# Patient Record
Sex: Female | Born: 1970 | Race: Black or African American | Hispanic: No | Marital: Married | Smoking: Never smoker
Health system: Southern US, Community
[De-identification: ages and names within clinical notes are randomized; demographics above are authoritative.]

## PROBLEM LIST (undated history)

## (undated) DIAGNOSIS — N390 Urinary tract infection, site not specified: Secondary | ICD-10-CM

## (undated) DIAGNOSIS — T7840XA Allergy, unspecified, initial encounter: Secondary | ICD-10-CM

## (undated) DIAGNOSIS — E119 Type 2 diabetes mellitus without complications: Secondary | ICD-10-CM

## (undated) DIAGNOSIS — E785 Hyperlipidemia, unspecified: Secondary | ICD-10-CM

## (undated) DIAGNOSIS — R7303 Prediabetes: Secondary | ICD-10-CM

## (undated) DIAGNOSIS — K219 Gastro-esophageal reflux disease without esophagitis: Secondary | ICD-10-CM

## (undated) HISTORY — DX: Allergy, unspecified, initial encounter: T78.40XA

## (undated) HISTORY — PX: TUBAL LIGATION: SHX77

## (undated) HISTORY — DX: Prediabetes: R73.03

## (undated) HISTORY — DX: Type 2 diabetes mellitus without complications: E11.9

## (undated) HISTORY — DX: Gastro-esophageal reflux disease without esophagitis: K21.9

## (undated) HISTORY — DX: Hyperlipidemia, unspecified: E78.5

## (undated) HISTORY — DX: Urinary tract infection, site not specified: N39.0

---

## 1999-07-07 ENCOUNTER — Other Ambulatory Visit: Admission: RE | Admit: 1999-07-07 | Discharge: 1999-07-07 | Payer: Self-pay | Admitting: Family Medicine

## 1999-12-04 ENCOUNTER — Encounter: Payer: Self-pay | Admitting: Family Medicine

## 1999-12-04 ENCOUNTER — Encounter: Admission: RE | Admit: 1999-12-04 | Discharge: 1999-12-04 | Payer: Self-pay | Admitting: Family Medicine

## 2000-06-02 ENCOUNTER — Other Ambulatory Visit: Admission: RE | Admit: 2000-06-02 | Discharge: 2000-06-02 | Payer: Self-pay | Admitting: Family Medicine

## 2007-05-10 ENCOUNTER — Ambulatory Visit (HOSPITAL_COMMUNITY): Admission: RE | Admit: 2007-05-10 | Discharge: 2007-05-10 | Payer: Self-pay | Admitting: Obstetrics and Gynecology

## 2013-04-15 ENCOUNTER — Encounter (HOSPITAL_COMMUNITY): Payer: Self-pay | Admitting: Emergency Medicine

## 2013-04-15 ENCOUNTER — Emergency Department (HOSPITAL_COMMUNITY)
Admission: EM | Admit: 2013-04-15 | Discharge: 2013-04-15 | Disposition: A | Discharge status: Home / Self Care | Payer: BC Managed Care – PPO | Source: Home / Self Care | Attending: Family Medicine | Admitting: Family Medicine

## 2013-04-15 ENCOUNTER — Other Ambulatory Visit (HOSPITAL_COMMUNITY)
Admission: RE | Admit: 2013-04-15 | Discharge: 2013-04-15 | Disposition: A | Discharge status: Home / Self Care | Payer: BC Managed Care – PPO | Source: Ambulatory Visit | Attending: Family Medicine | Admitting: Family Medicine

## 2013-04-15 DIAGNOSIS — Z113 Encounter for screening for infections with a predominantly sexual mode of transmission: Secondary | ICD-10-CM | POA: Insufficient documentation

## 2013-04-15 DIAGNOSIS — N760 Acute vaginitis: Secondary | ICD-10-CM

## 2013-04-15 DIAGNOSIS — N949 Unspecified condition associated with female genital organs and menstrual cycle: Secondary | ICD-10-CM

## 2013-04-15 DIAGNOSIS — R102 Pelvic and perineal pain: Secondary | ICD-10-CM

## 2013-04-15 LAB — CBC
HCT: 37.3 % (ref 36.0–46.0)
MCH: 26.2 pg (ref 26.0–34.0)
MCV: 78 fL (ref 78.0–100.0)
Platelets: 356 10*3/uL (ref 150–400)
RBC: 4.78 MIL/uL (ref 3.87–5.11)
WBC: 10.6 10*3/uL — ABNORMAL HIGH (ref 4.0–10.5)

## 2013-04-15 LAB — POCT URINALYSIS DIP (DEVICE)
Bilirubin Urine: NEGATIVE
Glucose, UA: NEGATIVE mg/dL
Ketones, ur: 15 mg/dL — AB
Leukocytes, UA: NEGATIVE
Nitrite: NEGATIVE
Specific Gravity, Urine: >=1.03 (ref 1.005–1.030)
Urobilinogen, UA: 0.2 mg/dL (ref 0.0–1.0)
pH: 5.5 (ref 5.0–8.0)

## 2013-04-15 LAB — POCT PREGNANCY, URINE: Preg Test, Ur: NEGATIVE

## 2013-04-15 MED ORDER — FLUCONAZOLE 150 MG PO TABS: 150.0000 mg | ORAL_TABLET | Freq: Once | ORAL | Status: DC

## 2013-04-15 MED ORDER — METRONIDAZOLE 500 MG PO TABS: 500.0000 mg | ORAL_TABLET | Freq: Two times a day (BID) | ORAL | Status: DC

## 2013-04-15 NOTE — ED Notes (Signed)
Pt c/o pelvic pain that radiates to her lower back onset today. Pt denies problems with urinating or bowel movements. Pt is alert and oriented.

## 2013-04-15 NOTE — ED Provider Notes (Signed)
Melinda Prince is a 42 y.o. female who presents to Urgent Care today for right lower corner and abdominal pain starting today. Patient has pain radiating for right groin to her right back. She denies any nausea vomiting or diarrhea. She denies any significant urinary frequency urgency or dysuria. She denies any blood in her stool. She has no history of abdominal surgery. She feels well otherwise.    History reviewed. No pertinent past medical history. History  Substance Use Topics   Smoking status: Never Smoker    Smokeless tobacco: Not on file   Alcohol Use: No   ROS as above Medications reviewed. No current facility-administered medications for this encounter.   Current Outpatient Prescriptions  Medication Sig Dispense Refill   fluconazole (DIFLUCAN) 150 MG tablet Take 1 tablet (150 mg total) by mouth once.  1 tablet  1   metroNIDAZOLE (FLAGYL) 500 MG tablet Take 1 tablet (500 mg total) by mouth 2 (two) times daily.  14 tablet  0    Exam:  BP 143/73   Pulse 81   Temp(Src) 98.3 F (36.8 C) (Oral)   Resp 16   SpO2 100%   LMP 04/07/2013 Gen: Well NAD, morbidly obese HEENT: EOMI,  MMM Lungs: CTABL Nl WOB Heart: RRR no MRG Abd: NABS, nondistended. Mildly tender right lower quadrant area no rebound or guarding Exts: Non edematous BL  LE, warm and well perfused.  GYN: Normal external genitalia. Vaginal canal with thin white discharge. Normal-appearing cervix. No masses palpated in the adnexa. Nontender cervix.  Results for orders placed during the hospital encounter of 04/15/13 (from the past 24 hour(s))  POCT URINALYSIS DIP (DEVICE)     Status: Abnormal   Collection Time    04/15/13  6:48 PM      Result Value Range   Glucose, UA NEGATIVE  NEGATIVE mg/dL   Bilirubin Urine NEGATIVE  NEGATIVE   Ketones, ur 15 (*) NEGATIVE mg/dL   Specific Gravity, Urine >=1.030  1.005 - 1.030   Hgb urine dipstick TRACE (*) NEGATIVE   pH 5.5  5.0 - 8.0   Protein, ur NEGATIVE  NEGATIVE  mg/dL   Urobilinogen, UA 0.2  0.0 - 1.0 mg/dL   Nitrite NEGATIVE  NEGATIVE   Leukocytes, UA NEGATIVE  NEGATIVE  POCT PREGNANCY, URINE     Status: None   Collection Time    04/15/13  6:54 PM      Result Value Range   Preg Test, Ur NEGATIVE  NEGATIVE  CBC     Status: Abnormal   Collection Time    04/15/13  7:18 PM      Result Value Range   WBC 10.6 (*) 4.0 - 10.5 K/uL   RBC 4.78  3.87 - 5.11 MIL/uL   Hemoglobin 12.5  12.0 - 15.0 g/dL   HCT 19.1  47.8 - 29.5 %   MCV 78.0  78.0 - 100.0 fL   MCH 26.2  26.0 - 34.0 pg   MCHC 33.5  30.0 - 36.0 g/dL   RDW 62.1  30.8 - 65.7 %   Platelets 356  150 - 400 K/uL   No results found.  Assessment and Plan: 42 y.o. female with right lower quadrant abdominal pain without clear etiology. Doubtful for appendicitis given relatively normal white blood cell count and non-peritoneal abdominal exam.  No palpable ovarian cyst is noted however my exam is limited by the patient's body habitus.  She does have a vaginal discharge which may be contributing to her pain.  Gonorrhea Chlamydia Trichomonas Gardnerella and yeast tests are pending. Plan to treat empirically with fluconazole and metronidazole.  Will followup if not improving Discussed warning signs or symptoms. Please see discharge instructions. Patient expresses understanding.      Rodolph Bong, MD 04/15/13 2008

## 2013-04-17 NOTE — ED Notes (Signed)
GC/Chlamydia neg., Affirm: Candida and Trich neg., Gardnerella pos.  Pt. adequately treated with Flagyl. Vassie Moselle 04/17/2013

## 2013-05-03 ENCOUNTER — Other Ambulatory Visit: Payer: Self-pay | Admitting: Obstetrics and Gynecology

## 2013-05-03 DIAGNOSIS — R109 Unspecified abdominal pain: Secondary | ICD-10-CM

## 2013-05-04 NOTE — Procedures (Signed)
°

## 2013-05-05 NOTE — Procedures (Signed)
°

## 2013-05-08 ENCOUNTER — Ambulatory Visit
Admission: RE | Admit: 2013-05-08 | Discharge: 2013-05-08 | Disposition: A | Discharge status: Home / Self Care | Payer: No Typology Code available for payment source | Source: Ambulatory Visit | Attending: Obstetrics and Gynecology | Admitting: Obstetrics and Gynecology

## 2013-05-08 DIAGNOSIS — R109 Unspecified abdominal pain: Secondary | ICD-10-CM

## 2013-05-08 MED ORDER — IOHEXOL 300 MG/ML  SOLN
125.0000 mL | Freq: Once | INTRAMUSCULAR | Status: AC | PRN
  Administered 2013-05-08: 125 mL via INTRAVENOUS

## 2013-05-08 NOTE — Procedures (Signed)
°

## 2013-10-27 ENCOUNTER — Other Ambulatory Visit (HOSPITAL_COMMUNITY): Payer: Self-pay | Admitting: Obstetrics and Gynecology

## 2013-10-27 DIAGNOSIS — Z1231 Encounter for screening mammogram for malignant neoplasm of breast: Secondary | ICD-10-CM

## 2013-10-31 ENCOUNTER — Ambulatory Visit (HOSPITAL_COMMUNITY)
Admission: RE | Admit: 2013-10-31 | Discharge: 2013-10-31 | Disposition: A | Discharge status: Home / Self Care | Payer: BC Managed Care – PPO | Source: Ambulatory Visit | Attending: Obstetrics and Gynecology | Admitting: Obstetrics and Gynecology

## 2013-10-31 DIAGNOSIS — Z1231 Encounter for screening mammogram for malignant neoplasm of breast: Secondary | ICD-10-CM | POA: Insufficient documentation

## 2014-03-19 ENCOUNTER — Ambulatory Visit (INDEPENDENT_AMBULATORY_CARE_PROVIDER_SITE_OTHER): Payer: BC Managed Care – PPO | Admitting: Family Medicine

## 2014-03-19 VITALS — BP 126/80 | HR 101 | Temp 98.2°F | Resp 20 | Ht 63.75 in | Wt 262.1 lb

## 2014-03-19 DIAGNOSIS — E559 Vitamin D deficiency, unspecified: Secondary | ICD-10-CM

## 2014-03-19 DIAGNOSIS — G4700 Insomnia, unspecified: Secondary | ICD-10-CM

## 2014-03-19 DIAGNOSIS — E282 Polycystic ovarian syndrome: Secondary | ICD-10-CM

## 2014-03-19 DIAGNOSIS — Z23 Encounter for immunization: Secondary | ICD-10-CM

## 2014-03-19 DIAGNOSIS — R7309 Other abnormal glucose: Secondary | ICD-10-CM

## 2014-03-19 DIAGNOSIS — E118 Type 2 diabetes mellitus with unspecified complications: Secondary | ICD-10-CM | POA: Insufficient documentation

## 2014-03-19 DIAGNOSIS — R7303 Prediabetes: Secondary | ICD-10-CM | POA: Insufficient documentation

## 2014-03-19 DIAGNOSIS — Z8249 Family history of ischemic heart disease and other diseases of the circulatory system: Secondary | ICD-10-CM

## 2014-03-19 MED ORDER — TRAZODONE HCL 50 MG PO TABS: 25.0000 mg | ORAL_TABLET | Freq: Every evening | ORAL | Status: DC | PRN

## 2014-03-19 NOTE — Progress Notes (Signed)
Subjective:   This chart was scribed for Delman Cheadle MD by Forrestine Him, [facility] Scribe. This patient was seen in room 11 and the patient's care was started 7:30 PM.    Patient ID: Melinda Prince, female    DOB: 22-Nov-1970, 43 y.o.   MRN: [id number]  Chief Complaint  Patient presents with   Annual Exam    Establish Care     HPI  HPI Comments: Melinda Prince is a 43 y.o. female with a PMHx of pre-diabetes who [facility] here for a complete annual physical examination and to establish care today. Previous PCP has relocated to [facility].   Pt reports some difficulty sleeping since the death of her Mother. She admits to using OTC Benadryl (3- 25 mg tabs) with intermittent improvement. Pt states Benadryl sometimes makes her groggy in the morning but not necessarily with each dosage. She denies trying any other sleep aids OTC. No mood symptoms such as anxiety associated.  She is followed by Dr. Johnney Ou. Last follow up she was started on Vitamin D 2000 and Metformin 500 three times daily for PCOS and pre-diabetes. At that visit, pt also had full blood work panel performed without any abnormal findings.  She reports a family history of diabetes (Mother and Father). No family or personal history of colon cancer. Mother past at the age of 73 and Father is still living.   She is UTD on mammogram follow up, pap smear follow up, and Tetanus. She has agreed to a Flu shot today. Melinda Prince was diagnosed with breast cancer and pt now has an annual mammogram as precaution. She admits to several irregular pap smears that are well controlled and managed by Dr. Garwin Prince.  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  Past Medical History  Diagnosis Date   Pre-diabetes     No Known Allergies   No current outpatient prescriptions on file prior to visit.   No current  facility-administered medications on file prior to visit.    History reviewed. No pertinent past surgical history.   Family History  Problem Relation Age of Onset   Diabetes Mother    Heart disease Mother    Hypertension Mother    Diabetes Father    Multiple sclerosis Sister     Review of Systems  Constitutional: Negative for fever and chills.  Psychiatric/Behavioral: Positive for sleep disturbance.  All other systems reviewed and are negative.   Triage Vitals: BP 126/80   Pulse 101   Temp(Src) 98.2 F (36.8 C) (Oral)   Resp 20   Ht 5' 3.75" (1.619 m)   Wt 262 lb 2 oz (118.899 kg)   BMI 45.36 kg/m2   SpO2 98%   LMP 02/01/2014   Objective:  Physical Exam  Nursing note and vitals reviewed. Constitutional: She is oriented to person, place, and time. She appears well-developed and well-nourished. No distress.  HENT:  Head: Normocephalic and atraumatic.  Right Ear: Hearing, tympanic membrane, external ear and ear canal normal.  Left Ear: Hearing, tympanic membrane, external ear and ear canal normal.  Eyes: EOM are normal.  Neck: Normal range of motion.  Cardiovascular: Normal rate, regular rhythm, S1 normal, S2 normal and normal heart sounds.   Pulmonary/Chest: Effort normal and breath sounds normal.  Abdominal: Soft. Bowel sounds are normal. She exhibits no distension. There is no tenderness. There is no rebound and no guarding.  Musculoskeletal: Normal range  of motion.  Neurological: She is alert and oriented to person, place, and time.  Skin: Skin is warm and dry.  Psychiatric: She has a normal mood and affect. Judgment normal.    Assessment & Plan:    As all routine labs were recently drawn at OB/GYN, will retrieve and coordinate with Dr. Garwin Prince regarding care. Plan for follow-up in a couple of mos in office. Elevated glucose - on metformin 500mg  tid due to pre-DM and PCOS - no trouble with compliance though diarrhea. Rec recheck a1c in 4 mos at f/u with flp and  cmp.  Severe obesity (BMI >= 40)  Polycystic ovarian syndrome  Insomnia - rec trying prn melatonin alternating with trial of prn trazodone. Ok to use benadryl alternatively but increase water intake.  Unspecified vitamin D deficiency - pt reports level increased from 23 to 26 after 4 wks of high dose rx supp.  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   metFORMIN (GLUCOPHAGE) 500 MG tablet    Sig: Take 1,500 mg by mouth daily with breakfast.   Prenatal Vit-Fe Fumarate-FA (MULTIVITAMIN-PRENATAL) 27-0.8 MG TABS tablet    Sig: Take 1 tablet by mouth daily at 12 noon.   traZODone (DESYREL) 50 MG tablet    Sig: Take 0.5-1 tablets (25-50 mg total) by mouth at bedtime as needed for sleep.    Dispense:  30 tablet    Refill:  3    I personally performed the services described in this documentation, which was scribed in my presence. The recorded information has been reviewed and considered, and addended by me as needed.  Delman Cheadle, MD MPH

## 2014-03-19 NOTE — Patient Instructions (Signed)
°Insomnia °Insomnia is frequent trouble falling and/or staying asleep. Insomnia can be a long term problem or a short term problem. Both are common. Insomnia can be a short term problem when the wakefulness is related to a certain stress or worry. Long term insomnia is often related to ongoing stress during waking hours and/or poor sleeping habits. Overtime, sleep deprivation itself can make the problem worse. Every little thing feels more severe because you are overtired and your ability to cope is decreased. °CAUSES  °· Stress, anxiety, and depression. °· Poor sleeping habits. °· Distractions such as TV in the bedroom. °· Naps close to bedtime. °· Engaging in emotionally charged conversations before bed. °· Technical reading before sleep. °· Alcohol and other sedatives. They may make the problem worse. They can hurt normal sleep patterns and normal dream activity. °· Stimulants such as caffeine for several hours prior to bedtime. °· Pain syndromes and shortness of breath can cause insomnia. °· Exercise late at night. °· Changing time zones may cause sleeping problems (jet lag). °It is sometimes helpful to have someone observe your sleeping patterns. They should look for periods of not breathing during the night (sleep apnea). They should also look to see how long those periods last. If you live alone or observers are uncertain, you can also be observed at a sleep clinic where your sleep patterns will be professionally monitored. Sleep apnea requires a checkup and treatment. Give your caregivers your medical history. Give your caregivers observations your family has made about your sleep.  °SYMPTOMS  °· Not feeling rested in the morning. °· Anxiety and restlessness at bedtime. °· Difficulty falling and staying asleep. °TREATMENT  °· Your caregiver may prescribe treatment for an underlying medical disorders. Your caregiver can give advice or help if you are using alcohol or other drugs for self-medication.  Treatment of underlying problems will usually eliminate insomnia problems. °· Medications can be prescribed for short time use. They are generally not recommended for lengthy use. °· Over-the-counter sleep medicines are not recommended for lengthy use. They can be habit forming. °· You can promote easier sleeping by making lifestyle changes such as: °¨ Using relaxation techniques that help with breathing and reduce muscle tension. °¨ Exercising earlier in the day. °¨ Changing your diet and the time of your last meal. No night time snacks. °¨ Establish a regular time to go to bed. °· Counseling can help with stressful problems and worry. °· Soothing music and white noise may be helpful if there are background noises you cannot remove. °· Stop tedious detailed work at least one hour before bedtime. °HOME CARE INSTRUCTIONS  °· Keep a diary. Inform your caregiver about your progress. This includes any medication side effects. See your caregiver regularly. Take note of: °¨ Times when you are asleep. °¨ Times when you are awake during the night. °¨ The quality of your sleep. °¨ How you feel the next day. °This information will help your caregiver care for you. °· Get out of bed if you are still awake after 15 minutes. Read or do some quiet activity. Keep the lights down. Wait until you feel sleepy and go back to bed. °· Keep regular sleeping and waking hours. Avoid naps. °· Exercise regularly. °· Avoid distractions at bedtime. Distractions include watching television or engaging in any intense or detailed activity like attempting to balance the household checkbook. °· Develop a bedtime ritual. Keep a familiar routine of bathing, brushing your teeth, climbing into bed at the same   time each night, listening to soothing music. Routines increase the success of falling to sleep faster.  Use relaxation techniques. This can be using breathing and muscle tension release routines. It can also include visualizing peaceful scenes. You can  also help control troubling or intruding thoughts by keeping your mind occupied with boring or repetitive thoughts like the old concept of counting sheep. You can make it more creative like imagining planting one beautiful flower after another in your backyard garden.  During your day, work to eliminate stress. When this is not possible use some of the previous suggestions to help reduce the anxiety that accompanies stressful situations. MAKE SURE YOU:   Understand these instructions.  Will watch your condition.  Will get help right away if you are not doing well or get worse. Document Released: 06/12/2000 Document Revised: 09/07/2011 Document Reviewed: 07/13/2007 [facility] Patient Information 2015 [place], [place]. This information is not intended to replace advice given to you by your health care provider. Make sure you discuss any questions you have with your health care provider.  Insulin Resistance Blood sugar (glucose) levels are controlled by a hormone called insulin. Insulin is made by your pancreas. When your blood glucose goes up, insulin is released into your blood. Insulin is required for your body to function normally. However, your body can become resistant to your own insulin or to insulin given to treat diabetes. In either case, insulin resistance can lead to serious problems. These problems include:  Type 2 diabetes.  Heart disease.  High blood pressure.  Stroke.  Polycystic ovary syndrome.  Fatty liver. CAUSES  Insulin resistance can develop for many different reasons. It is more likely to happen in people with these conditions or characteristics:  Obesity.  Inactivity.  Pregnancy.  High blood pressure.  Stress.  Steroid use.  Infection or severe illness.  Increased levels of cholesterol and triglycerides. SYMPTOMS  There are no symptoms. You may have symptoms related to the various complications of insulin resistance.  DIAGNOSIS  Several different  things can make your caregiver suspect you have insulin resistance. These include:  High blood glucose (hyperglycemia).  Abnormal cholesterol levels.  High uric acid levels.  Changes related to blood pressure.  Changes related to inflammation. Insulin resistance can be determined with blood tests. An elevated insulin level when you have not eaten might suggest resistance. Other more complicated tests are sometimes necessary. TREATMENT  Lifestyle changes are the most important treatment for insulin resistance.   If you are overweight and you have insulin resistance, you can improve your insulin sensitivity by losing weight.  Moderate exercise for 30-40 minutes, 4 days a week, can improve insulin sensitivity. Some medicines can also help improve your insulin sensitivity. Your caregiver can discuss these with you if they are appropriate.  HOME CARE INSTRUCTIONS   Do not smoke.  Keep your weight at a healthy level.  Get exercise.  If you have diabetes, follow your caregiver's directions.  If you have high blood pressure, follow your caregiver's directions.  Only take prescription medicines for pain, fever, or discomfort as directed by your caregiver. SEEK MEDICAL CARE IF:   You are diabetic and you are having problems keeping your blood glucose levels at target range.  You are having episodes of low blood glucose (hypoglycemia).  You feel you might be having side effects from your medicines.  You have symptoms of an illness that is not improving after 3-4 days.  You have a sore or wound that is not  healing.  You notice a change in vision or a new problem with your vision. SEEK IMMEDIATE MEDICAL CARE IF:   Your blood glucose goes below 70, especially if you have confusion, lightheadedness, or other symptoms with it.  Your blood glucose is very high (as advised by your caregiver) twice in a row.  You pass out.  You have chest pain or trouble breathing.  You have a  sudden, severe headache.  You have sudden weakness in one arm or one leg.  You have sudden difficulty speaking or swallowing.  You develop vomiting or diarrhea that is getting worse or not improving after 1 day. Document Released: 08/04/2005 Document Revised: 12/15/2011 Document Reviewed: 11/24/2012 [facility] Patient Information 2015 [place], [place]. This information is not intended to replace advice given to you by your health care provider. Make sure you discuss any questions you have with your health care provider. Diabetes Mellitus and Food It is important for you to manage your blood sugar (glucose) level. Your blood glucose level can be greatly affected by what you eat. Eating healthier foods in the appropriate amounts throughout the day at about the same time each day will help you control your blood glucose level. It can also help slow or prevent worsening of your diabetes mellitus. Healthy eating may even help you improve the level of your blood pressure and reach or maintain a healthy weight.  HOW CAN FOOD AFFECT ME? Carbohydrates Carbohydrates affect your blood glucose level more than any other type of food. Your dietitian will help you determine how many carbohydrates to eat at each meal and teach you how to count carbohydrates. Counting carbohydrates is important to keep your blood glucose at a healthy level, especially if you are using insulin or taking certain medicines for diabetes mellitus. Alcohol Alcohol can cause sudden decreases in blood glucose (hypoglycemia), especially if you use insulin or take certain medicines for diabetes mellitus. Hypoglycemia can be a life-threatening condition. Symptoms of hypoglycemia (sleepiness, dizziness, and disorientation) are similar to symptoms of having too much alcohol.  If your health care provider has given you approval to drink alcohol, do so in moderation and use the following guidelines:  Women should not have more than one drink per  day, and men should not have more than two drinks per day. One drink is equal to:  12 oz of beer.  5 oz of wine.  1 oz of hard liquor.  Do not drink on an empty stomach.  Keep yourself hydrated. Have water, diet soda, or unsweetened iced tea.  Regular soda, juice, and other mixers might contain a lot of carbohydrates and should be counted. WHAT FOODS ARE NOT RECOMMENDED? As you make food choices, it is important to remember that all foods are not the same. Some foods have fewer nutrients per serving than other foods, even though they might have the same number of calories or carbohydrates. It is difficult to get your body what it needs when you eat foods with fewer nutrients. Examples of foods that you should avoid that are high in calories and carbohydrates but low in nutrients include:  Trans fats (most processed foods list trans fats on the Nutrition Facts label).  Regular soda.  Juice.  Candy.  Sweets, such as cake, pie, doughnuts, and cookies.  Fried foods. WHAT FOODS CAN I EAT? Have nutrient-rich foods, which will nourish your body and keep you healthy. The food you should eat also will depend on several factors, including:  The calories you need.  The medicines you take.  Your weight.  Your blood glucose level.  Your blood pressure level.  Your cholesterol level. You also should eat a variety of foods, including:  Protein, such as meat, poultry, fish, tofu, nuts, and seeds (lean animal proteins are best).  Fruits.  Vegetables.  Dairy products, such as milk, cheese, and yogurt (low fat is best).  Breads, grains, pasta, cereal, rice, and beans.  Fats such as olive oil, trans fat-free margarine, canola oil, avocado, and olives. DOES EVERYONE WITH DIABETES MELLITUS HAVE THE SAME MEAL PLAN? Because every person with diabetes mellitus is different, there is not one meal plan that works for everyone. It is very important that you meet with a dietitian who will  help you create a meal plan that is just right for you. Document Released: 03/12/2005 Document Revised: 06/20/2013 Document Reviewed: 05/12/2013 [facility] Patient Information 2015 [place], [place]. This information is not intended to replace advice given to you by your health care provider. Make sure you discuss any questions you have with your health care provider.

## 2014-03-19 NOTE — Progress Notes (Deleted)
Melinda Prince is a 43 y.o. female who presents to Urgent Care today with complaints of ***  PMH reviewed.  Past Medical History  Diagnosis Date   Pre-diabetes    History reviewed. No pertinent past surgical history.  Medications reviewed. Current Outpatient Prescriptions  Medication Sig Dispense Refill   Cholecalciferol (VITAMIN D) 2000 UNITS CAPS Take 1 capsule by mouth daily.       metFORMIN (GLUCOPHAGE) 500 MG tablet Take 1,500 mg by mouth daily with breakfast.       Prenatal Vit-Fe Fumarate-FA (MULTIVITAMIN-PRENATAL) 27-0.8 MG TABS tablet Take 1 tablet by mouth daily at 12 noon.       No current facility-administered medications for this visit.    ROS as above otherwise neg.  No chest pain, palpitations, SOB, Fever, Chills, Abd pain, N/V/D.   Physical Exam:  BP 126/80   Pulse 101   Temp(Src) 98.2 F (36.8 C) (Oral)   Resp 20   Ht 5' 3.75" (1.619 m)   Wt 262 lb 2 oz (118.899 kg)   BMI 45.36 kg/m2   SpO2 98%   LMP 02/01/2014 Gen:  Alert, cooperative patient who appears stated age in no acute distress.  Vital signs reviewed. HEENT: EOMI,  MMM Pulm:  Clear to auscultation bilaterally with good air movement.  No wheezes or rales noted.   Cardiac:  Regular rate and rhythm without murmur auscultated.  Good S1/S2. Abd:  Soft/nondistended/nontender.  Good bowel sounds throughout all four quadrants.  No masses noted.  Exts: Non edematous BL  LE, warm and well perfused.   Assessment and Plan:

## 2014-04-02 NOTE — Progress Notes (Signed)
appt scheduled for follow up 06/08/14 with Dr. Brigitte Pulse.

## 2014-06-08 ENCOUNTER — Ambulatory Visit: Payer: BC Managed Care – PPO | Admitting: Family Medicine

## 2014-06-18 IMAGING — CT CT ABD-PELV W/ CM
3 of 5 series · 12 of 36 positions shown, 18 images · IV contrast (READICAT/WATER & [ID] OMNI 300)
Comparison: None.

CLINICAL DATA: Right abdominal pain for months, No previous surgery

EXAM:
CT ABDOMEN AND PELVIS WITH CONTRAST
TECHNIQUE: Multidetector CT imaging of the abdomen and pelvis was performed
using the standard protocol following bolus administration of
intravenous contrast.
CONTRAST:  125mL OMNIPAQUE IOHEXOL 300 MG/ML  SOLN

[Series 3: abd/pelvis · axial · 0.98mm/px · z∈[-333,+22]mm · 7 of 95 slices shown, 12 images]
[im 12/95  soft-tissue]
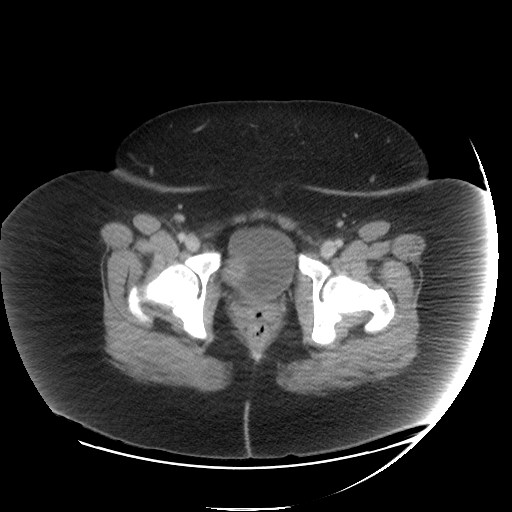
[im 12/95  bone]
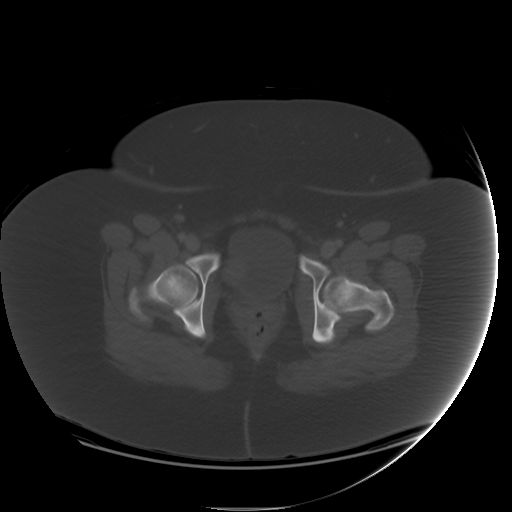
[im 24/95  soft-tissue]
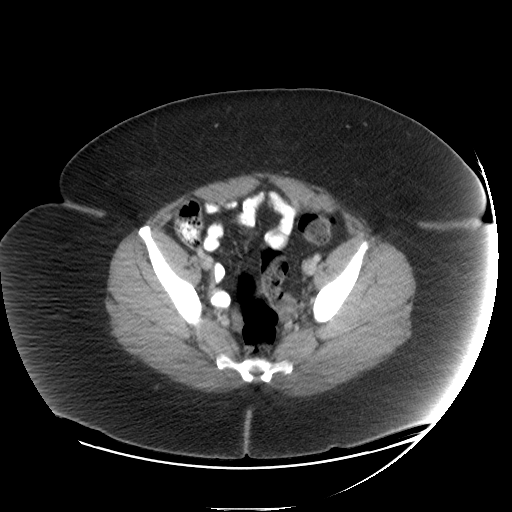
[im 36/95  soft-tissue]
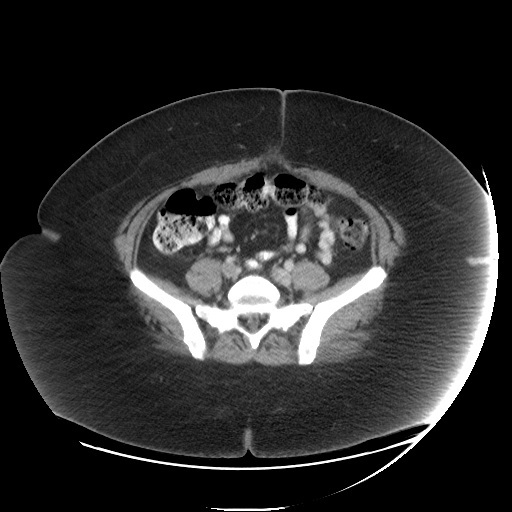
[im 48/95  soft-tissue]
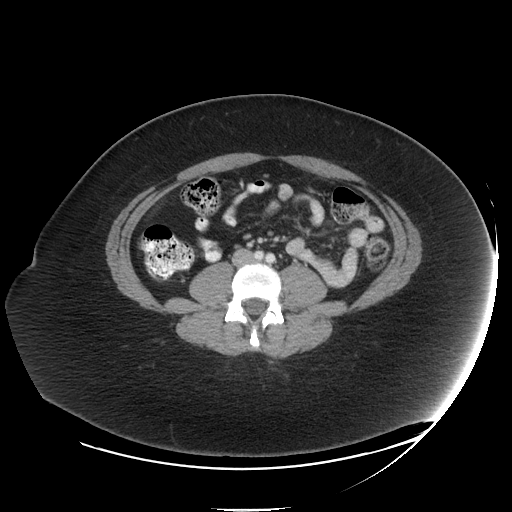
[im 48/95  lung]
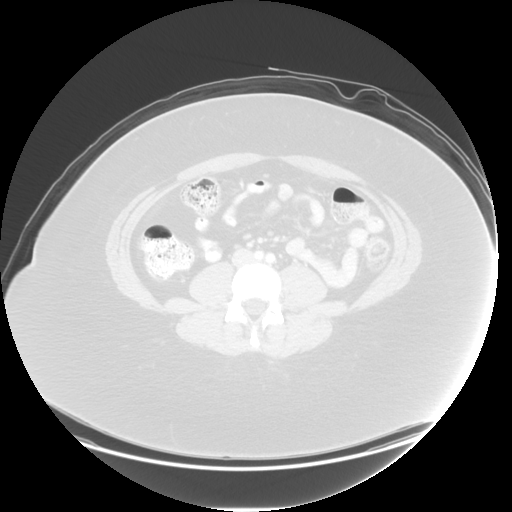
[im 59/95  soft-tissue]
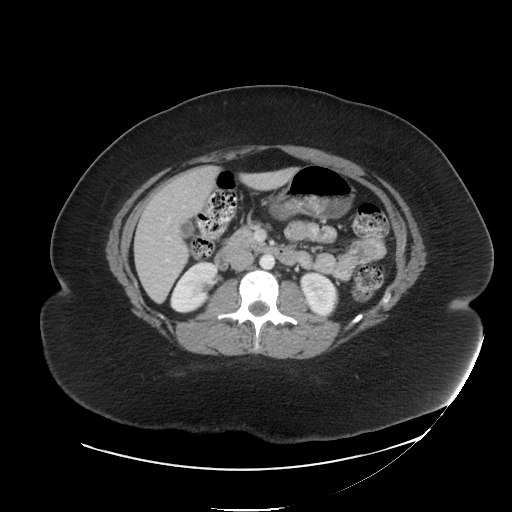
[im 59/95  lung]
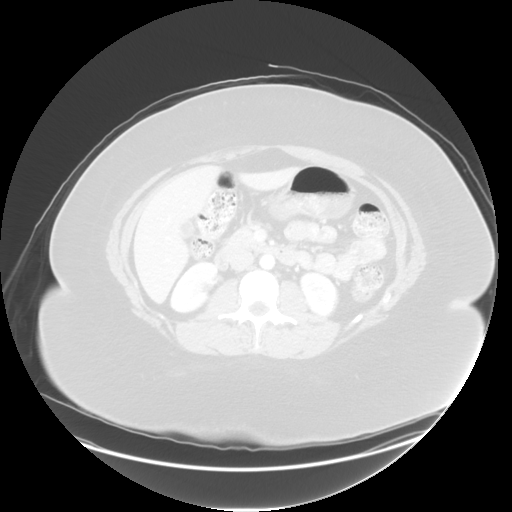
[im 71/95  soft-tissue]
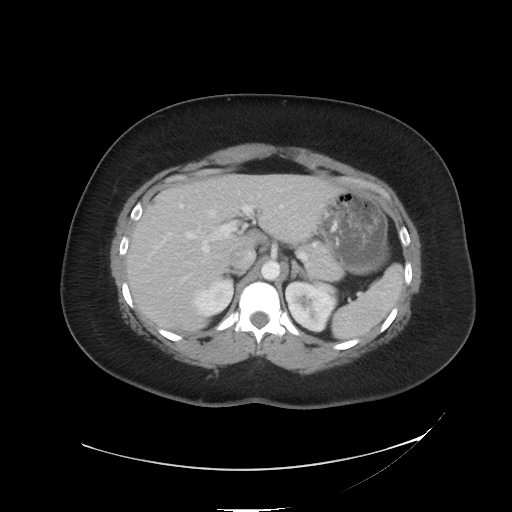
[im 71/95  lung]
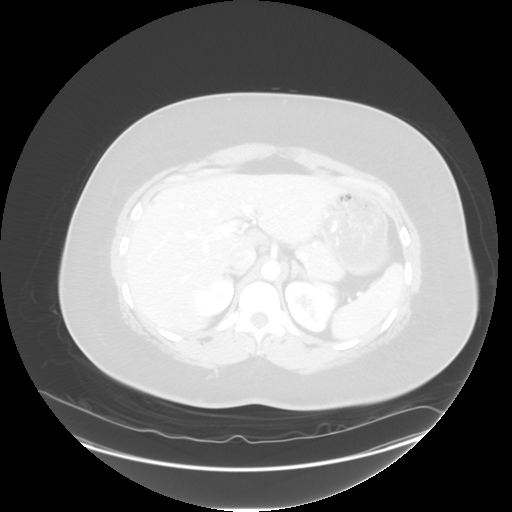
[im 83/95  soft-tissue]
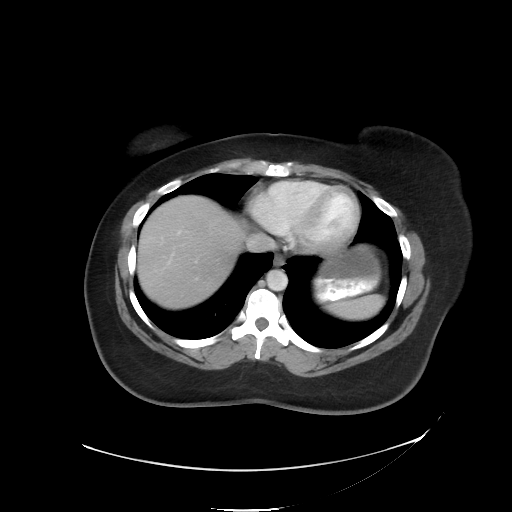
[im 83/95  lung]
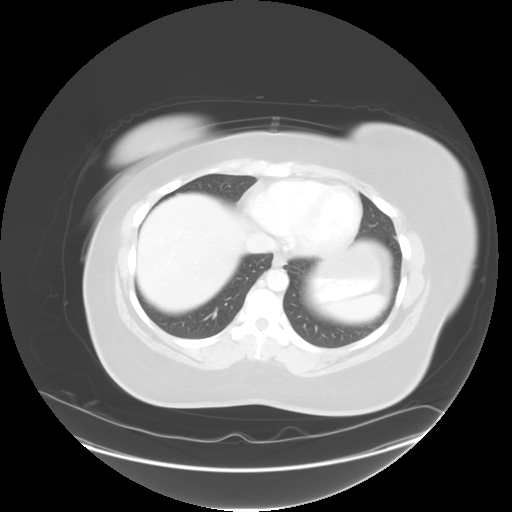

[Series 601: coronal body · coronal · 0.98mm/px · 1 of 149 slices shown, 2 images]
[im 50/149  soft-tissue]
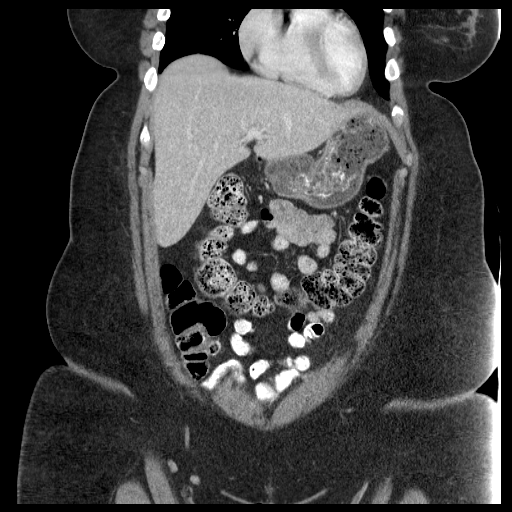
[im 50/149  bone]
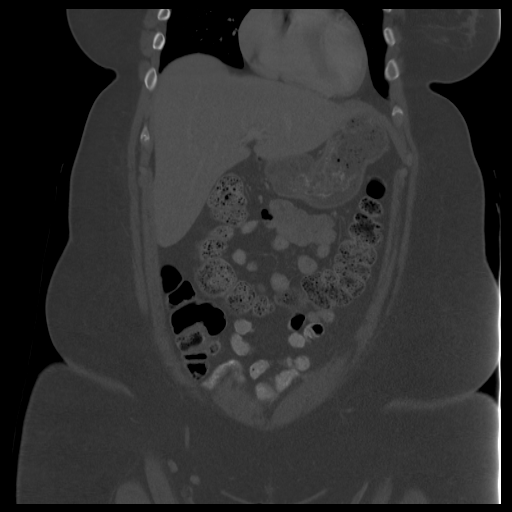

[Series 602: sagittal body · sagittal · 0.98mm/px · 4 of 196 slices shown]
[im 23/196  soft-tissue]
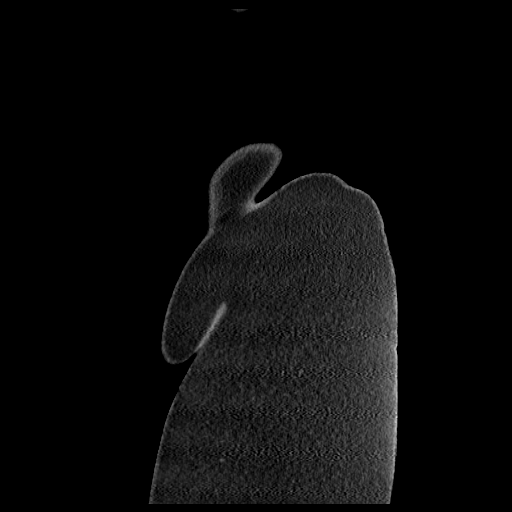
[im 46/196  soft-tissue]
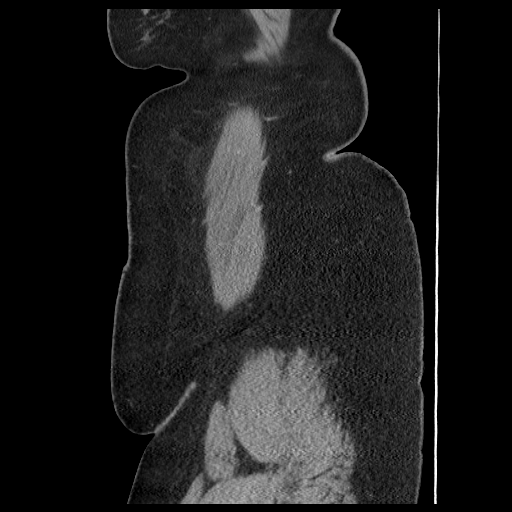
[im 69/196  soft-tissue]
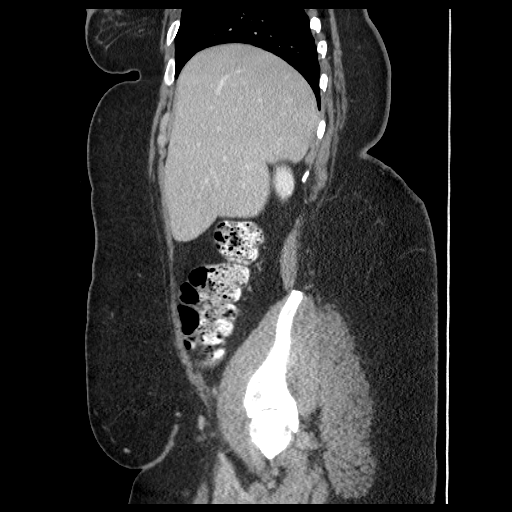
[im 92/196  soft-tissue]
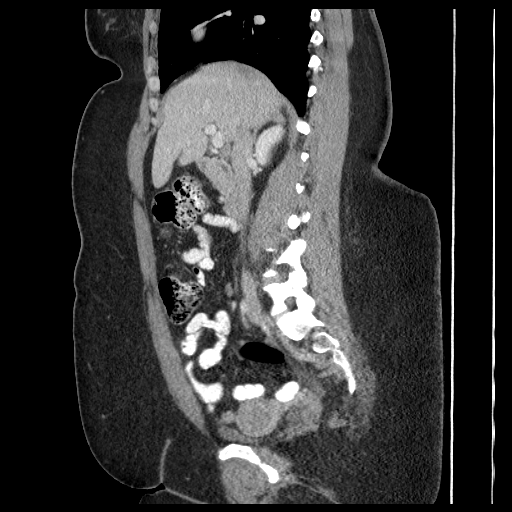

[12 of 36 positions shown; findings below may reference images not displayed]

FINDINGS: Lung bases are unremarkable. There are streaky artifacts from
patient's large body habitus. Sagittal images of the spine shows
mild degenerative changes lower thoracic spine. Abdominal aorta is
unremarkable.

Enhanced liver is unremarkable. Gallbladder is contracted without
evidence of calcified gallstones. The pancreas, spleen and adrenal
glands are unremarkable. Kidneys are symmetrical in size and
enhancement. No hydronephrosis or hydroureter. Abundant stool
throughout the colon. No small bowel obstruction. No ascites or free
air. No adenopathy.

The terminal ileum is unremarkable. Normal appendix partially
visualized in axial image 70. There is a low-density structure
within right uterine fundus measures about 2.9 cm. This is best seen
in sagittal image 93 suspicious for myometrial fibroid. A probable
hemorrhagic follicle within left ovary measures 1.6 cm. No adnexal
masses noted. The urinary bladder is unremarkable.

Kidneys are symmetrical in size and enhancement. No hydronephrosis
or hydroureter. Delayed renal images shows bilateral renal
symmetrical excretion. Bilateral visualized ureter is unremarkable.
IMPRESSION: 1. No acute inflammatory process within abdomen or pelvis.
2. Abundant stool in right colon transverse colon and left colon. No
colonic obstruction.
3. No small bowel obstruction.
4. No hydronephrosis or hydroureter.
5. Normal appendix. No pericecal inflammation.
6. There is probable myometrial fibroid in right uterine fundus
measures about 2.9 cm. Confirmation with pelvic ultrasound is
recommended. Probable hemorrhagic follicle within left ovary
measures 1.6 cm. No pelvic free fluid.

## 2014-07-02 ENCOUNTER — Ambulatory Visit (INDEPENDENT_AMBULATORY_CARE_PROVIDER_SITE_OTHER): Payer: 59

## 2014-07-02 ENCOUNTER — Ambulatory Visit (INDEPENDENT_AMBULATORY_CARE_PROVIDER_SITE_OTHER): Payer: 59 | Admitting: Physician Assistant

## 2014-07-02 VITALS — BP 140/90 | HR 75 | Temp 98.4°F | Resp 16 | Ht 65.0 in | Wt 268.0 lb

## 2014-07-02 DIAGNOSIS — R05 Cough: Secondary | ICD-10-CM

## 2014-07-02 DIAGNOSIS — R062 Wheezing: Secondary | ICD-10-CM

## 2014-07-02 DIAGNOSIS — R059 Cough, unspecified: Secondary | ICD-10-CM

## 2014-07-02 MED ORDER — ALBUTEROL SULFATE HFA 108 (90 BASE) MCG/ACT IN AERS: 2.0000 | INHALATION_SPRAY | RESPIRATORY_TRACT | Status: DC | PRN

## 2014-07-02 MED ORDER — IPRATROPIUM BROMIDE 0.02 % IN SOLN
0.5000 mg | Freq: Once | RESPIRATORY_TRACT | Status: AC
  Administered 2014-07-02: 0.5 mg via RESPIRATORY_TRACT

## 2014-07-02 MED ORDER — BECLOMETHASONE DIPROPIONATE 40 MCG/ACT IN AERS: 2.0000 | INHALATION_SPRAY | Freq: Two times a day (BID) | RESPIRATORY_TRACT | Status: DC

## 2014-07-02 MED ORDER — HYDROCOD POLST-CHLORPHEN POLST 10-8 MG/5ML PO LQCR: 5.0000 mL | Freq: Two times a day (BID) | ORAL | Status: DC | PRN

## 2014-07-02 MED ORDER — ALBUTEROL SULFATE (2.5 MG/3ML) 0.083% IN NEBU: 2.5000 mg | INHALATION_SOLUTION | Freq: Once | RESPIRATORY_TRACT | Status: DC

## 2014-07-02 MED ORDER — ALBUTEROL SULFATE (2.5 MG/3ML) 0.083% IN NEBU
2.5000 mg | INHALATION_SOLUTION | Freq: Once | RESPIRATORY_TRACT | Status: AC
  Administered 2014-07-02: 2.5 mg via RESPIRATORY_TRACT

## 2014-07-02 MED ORDER — IPRATROPIUM BROMIDE 0.02 % IN SOLN: 0.5000 mg | Freq: Once | RESPIRATORY_TRACT | Status: DC

## 2014-07-02 MED ORDER — GUAIFENESIN ER 1200 MG PO TB12: 1.0000 | ORAL_TABLET | Freq: Two times a day (BID) | ORAL | Status: DC | PRN

## 2014-07-02 NOTE — Progress Notes (Signed)
Subjective:    Patient ID: Melinda Prince, female    DOB: June 02, 1971, 44 y.o.   MRN: [id number]  HPI  This is a 44 year old female with PMH DM who is presenting with 1 week of cough. The cough is dry and staying the same since onset. Over the past few days she has developed wheezing and SOB. SOB occurs with activity only. She has never had wheezing before and does not have a history of asthma. She is also having bilateral inferior rib soreness.She is having some nasal congestion. Illness started with sore throat but this has resolved. She denies sinus pressure, otalgia,fever or chills. She has tried Copy[employer], advertising and nyquil - not helping except helping her sleep. She is not a smoker.   Review of Systems  Constitutional: Negative for fever and chills.  HENT: Positive for congestion and sore throat. Negative for ear pain and sinus pressure.   Eyes: Negative for redness.  Respiratory: Positive for cough, shortness of breath and wheezing.   Gastrointestinal: Negative for nausea, vomiting, abdominal pain and diarrhea.  Musculoskeletal: Positive for myalgias.  Skin: Negative for rash.  Psychiatric/Behavioral: Negative for sleep disturbance.    Patient Active Problem List   Diagnosis Date Noted   Family history of cardiovascular disease 03/19/2014   Unspecified vitamin D deficiency 03/19/2014   Insomnia 03/19/2014   Polycystic ovarian syndrome 03/19/2014   Severe obesity (BMI >= 40) 03/19/2014   Elevated glucose 03/19/2014   Prior to Admission medications   Medication Sig Start Date End Date Taking? Authorizing Provider  Cholecalciferol (VITAMIN D) 2000 UNITS CAPS Take 1 capsule by mouth daily.   Yes Historical Provider, MD  metFORMIN (GLUCOPHAGE) 500 MG tablet Take 1,500 mg by mouth daily with breakfast.   Yes Historical Provider, MD  Prenatal Vit-Fe Fumarate-FA (MULTIVITAMIN-PRENATAL) 27-0.8 MG TABS tablet Take 1 tablet by mouth daily at 12 noon.   Yes Historical Provider, MD   traZODone (DESYREL) 50 MG tablet Take 0.5-1 tablets (25-50 mg total) by mouth at bedtime as needed for sleep. 03/19/14  Yes Shawnee Knapp, MD   No Known Allergies  Patient's social and family history were reviewed.    Objective:   Physical Exam  Constitutional: She is oriented to person, place, and time. She appears well-developed and well-nourished. No distress.  HENT:  Head: Normocephalic and atraumatic.  Right Ear: Hearing, external ear and ear canal normal.  Left Ear: Hearing, tympanic membrane, external ear and ear canal normal.  Nose: Nose normal. Right sinus exhibits no maxillary sinus tenderness and no frontal sinus tenderness. Left sinus exhibits no maxillary sinus tenderness and no frontal sinus tenderness.  Mouth/Throat: Uvula is midline, oropharynx is clear and moist and mucous membranes are normal.  Right TM blocked by cerumen  Eyes: Conjunctivae and lids are normal. Right eye exhibits no discharge. Left eye exhibits no discharge. No scleral icterus.  Cardiovascular: Normal rate, regular rhythm, normal heart sounds, intact distal pulses and normal pulses.   No murmur heard. Pulmonary/Chest: Effort normal. No respiratory distress. She has wheezes (diffuse). She has rhonchi. She has no rales.    Bilateral inferior rib tenderness Wheezing did not improve with 1st duoneb. Wheezing somewhat improved with 2nd duoneb, but still present R>L  Musculoskeletal: Normal range of motion.  Lymphadenopathy:       Head (right side): No submental, no submandibular, no tonsillar and no occipital adenopathy present.       Head (left side): No submental, no submandibular, no tonsillar and no  occipital adenopathy present.    She has no cervical adenopathy.  Neurological: She is alert and oriented to person, place, and time.  Skin: Skin is warm, dry and intact. No lesion and no rash noted.  Psychiatric: She has a normal mood and affect. Her speech is normal and behavior is normal. Thought content  normal.   BP 140/90 mmHg   Pulse 75   Temp(Src) 98.4 F (36.9 C) (Oral)   Resp 16   Ht 5\' 5"  (1.651 m)   Wt 268 lb (121.564 kg)   BMI 44.60 kg/m2   SpO2 100%   LMP 06/25/2014  [facility] reading (PRIMARY) by  Dr. Ouida Sills: negative     Assessment & Plan:  1. Wheezing 2. Cough Pt likely has viral illness with airway reactivity. Chest xray was negative. Cough is dry. She is not having fever or chills. She will use albuterol prn wheezing and SOB. She will use qvar BID for 14 days. She will also use mucinex and tussionex for symptom control. Will return in 7-10 days if symptoms worsen or fail to improve.  - albuterol (PROVENTIL) (2.5 MG/3ML) 0.083% nebulizer solution 2.5 mg; Take 3 mLs (2.5 mg total) by nebulization once. - ipratropium (ATROVENT) nebulizer solution 0.5 mg; Take 2.5 mLs (0.5 mg total) by nebulization once. - DG Chest 2 View; Future - albuterol (PROVENTIL) (2.5 MG/3ML) 0.083% nebulizer solution 2.5 mg; Take 3 mLs (2.5 mg total) by nebulization once. - ipratropium (ATROVENT) nebulizer solution 0.5 mg; Take 2.5 mLs (0.5 mg total) by nebulization once. - albuterol (PROVENTIL HFA;VENTOLIN HFA) 108 (90 BASE) MCG/ACT inhaler; Inhale 2 puffs into the lungs every 4 (four) hours as needed for wheezing or shortness of breath (cough, shortness of breath or wheezing.).  Dispense: 1 Inhaler; Refill: 0 - beclomethasone (QVAR) 40 MCG/ACT inhaler; Inhale 2 puffs into the lungs 2 (two) times daily.  Dispense: 1 Inhaler; Refill: 0 - Guaifenesin (MUCINEX MAXIMUM STRENGTH) 1200 MG TB12; Take 1 tablet (1,200 mg total) by mouth every 12 (twelve) hours as needed.  Dispense: 14 tablet; Refill: 1 - chlorpheniramine-HYDROcodone (TUSSIONEX PENNKINETIC ER) 10-8 MG/5ML LQCR; Take 5 mLs by mouth every 12 (twelve) hours as needed for cough (cough).  Dispense: 80 mL; Refill: 0   Melinda Prince V. Drenda Freeze, MHS [facility]  07/02/2014

## 2014-07-02 NOTE — Patient Instructions (Signed)
Use steroid inhaler twice a day for 2 weeks. Use albuterol as needed for wheezing and shortness of breath. Stop taking your home cold medications. Start taking mucinex twice a day and cough syrup at night. Return in 7 days if your symptoms are not improving.

## 2014-07-19 ENCOUNTER — Telehealth: Payer: Self-pay | Admitting: *Deleted

## 2014-07-19 NOTE — Telephone Encounter (Signed)
Phoned and cancelled tomorrow's OV and patient stated she'd call back Monday to reschedule.

## 2014-07-20 ENCOUNTER — Ambulatory Visit: Payer: BC Managed Care – PPO | Admitting: Family Medicine

## 2014-08-31 ENCOUNTER — Ambulatory Visit: Payer: 59 | Admitting: Family Medicine

## 2015-01-08 ENCOUNTER — Ambulatory Visit (INDEPENDENT_AMBULATORY_CARE_PROVIDER_SITE_OTHER): Payer: 59 | Admitting: Internal Medicine

## 2015-01-08 ENCOUNTER — Encounter: Payer: Self-pay | Admitting: Internal Medicine

## 2015-01-08 ENCOUNTER — Other Ambulatory Visit (INDEPENDENT_AMBULATORY_CARE_PROVIDER_SITE_OTHER): Payer: 59

## 2015-01-08 VITALS — BP 136/86 | HR 89 | Temp 98.4°F | Resp 16 | Ht 65.5 in | Wt 275.0 lb

## 2015-01-08 DIAGNOSIS — R51 Headache: Secondary | ICD-10-CM

## 2015-01-08 DIAGNOSIS — J3089 Other allergic rhinitis: Secondary | ICD-10-CM | POA: Diagnosis not present

## 2015-01-08 DIAGNOSIS — R7309 Other abnormal glucose: Secondary | ICD-10-CM | POA: Diagnosis not present

## 2015-01-08 DIAGNOSIS — R519 Headache, unspecified: Secondary | ICD-10-CM | POA: Insufficient documentation

## 2015-01-08 DIAGNOSIS — J309 Allergic rhinitis, unspecified: Secondary | ICD-10-CM | POA: Insufficient documentation

## 2015-01-08 DIAGNOSIS — R7303 Prediabetes: Secondary | ICD-10-CM

## 2015-01-08 LAB — COMPREHENSIVE METABOLIC PANEL
ALT: 11 U/L (ref 0–35)
AST: 13 U/L (ref 0–37)
Albumin: 4 g/dL (ref 3.5–5.2)
Alkaline Phosphatase: 45 U/L (ref 39–117)
BUN: 11 mg/dL (ref 6–23)
CHLORIDE: 104 meq/L (ref 96–112)
CO2: 28 meq/L (ref 19–32)
Calcium: 9.2 mg/dL (ref 8.4–10.5)
Creatinine, Ser: 0.87 mg/dL (ref 0.40–1.20)
GFR: 90.04 mL/min (ref >60.00)
Glucose, Bld: 72 mg/dL (ref 70–99)
Potassium: 3.9 mEq/L (ref 3.5–5.1)
SODIUM: 140 meq/L (ref 135–145)
TOTAL PROTEIN: 7.7 g/dL (ref 6.0–8.3)
Total Bilirubin: 0.3 mg/dL (ref 0.2–1.2)

## 2015-01-08 LAB — LIPID PANEL
CHOLESTEROL: 170 mg/dL (ref 0–200)
HDL: 53.1 mg/dL (ref >39.00)
LDL CALC: 85 mg/dL (ref 0–99)
NonHDL: 116.9
TRIGLYCERIDES: 158 mg/dL — AB (ref 0.0–149.0)
Total CHOL/HDL Ratio: 3
VLDL: 31.6 mg/dL (ref 0.0–40.0)

## 2015-01-08 LAB — CBC
HEMATOCRIT: 39.1 % (ref 36.0–46.0)
Hemoglobin: 12.8 g/dL (ref 12.0–15.0)
MCHC: 32.6 g/dL (ref 30.0–36.0)
MCV: 79.2 fl (ref 78.0–100.0)
Platelets: 358 10*3/uL (ref 150.0–400.0)
RBC: 4.94 Mil/uL (ref 3.87–5.11)
RDW: 15.9 % — AB (ref 11.5–15.5)
WBC: 9 10*3/uL (ref 4.0–10.5)

## 2015-01-08 LAB — HEMOGLOBIN A1C: Hgb A1c MFr Bld: 6 % (ref 4.6–6.5)

## 2015-01-08 MED ORDER — OLOPATADINE HCL 0.2 % OP SOLN: 2.0000 [drp] | OPHTHALMIC | Status: DC | PRN

## 2015-01-08 MED ORDER — FLUTICASONE PROPIONATE 50 MCG/ACT NA SUSP: 2.0000 | Freq: Every day | NASAL | Status: DC

## 2015-01-08 NOTE — Assessment & Plan Note (Signed)
Checking HgA1c as it has not been checked in some time. Talked to her about the role of exercise in preventing progression to diabetes.

## 2015-01-08 NOTE — Assessment & Plan Note (Signed)
Rx for flonase and pataday eye drops for her symptoms. No indication for antibiotics at today's visit today.

## 2015-01-08 NOTE — Assessment & Plan Note (Signed)
Given her BMI will check home sleep study to rule out OSA.

## 2015-01-08 NOTE — Progress Notes (Signed)
Pre visit review using our clinic review tool, if applicable. No additional management support is needed unless otherwise documented below in the visit note.

## 2015-01-08 NOTE — Progress Notes (Signed)
° °  Subjective:    Patient ID: Melinda Prince, female    DOB: Apr 11, 1971, 44 y.o.   MRN: [id number]  HPI The patient is a new 44 YO female coming in for two reasons: seasonal allergies. She has used flonase and zyrtec and eye drops in the past. Having eye irritation and watering a lot lately. Not currently taking anything for the allergies. Some nasal congestion and drainage. No fevers or chills. No SOB.  The second problem is morning headaches. She is waking up at night time sometimes and having difficulty getting back to sleep with thoughts. Taking trazodone and that helps some. The morning headaches mostly go away as the day goes on. Never evaluated before.   Review of Systems  Constitutional: Negative for fever, activity change, appetite change, fatigue and unexpected weight change.  HENT: Positive for congestion, postnasal drip and rhinorrhea. Negative for ear discharge, ear pain, sinus pressure, sore throat and trouble swallowing.   Eyes: Positive for itching. Negative for photophobia, discharge, redness and visual disturbance.  Respiratory: Negative for cough, chest tightness, shortness of breath and wheezing.   Cardiovascular: Negative for chest pain, palpitations and leg swelling.  Gastrointestinal: Negative for nausea, abdominal pain, diarrhea, constipation and abdominal distention.  Musculoskeletal: Negative.   Skin: Negative.   Neurological: Negative.   Psychiatric/Behavioral: Negative.       Objective:   Physical Exam  Constitutional: She is oriented to person, place, and time. She appears well-developed and well-nourished.  Overweight  HENT:  Head: Normocephalic and atraumatic.  Oropharynx with redness, nasal turbinates swollen and some redness.   Eyes: EOM are normal.  Neck: Normal range of motion.  Cardiovascular: Normal rate and regular rhythm.   Pulmonary/Chest: Effort normal and breath sounds normal. No respiratory distress. She has no wheezes. She has no rales.   Abdominal: Soft. She exhibits no distension. There is no tenderness. There is no rebound.  Musculoskeletal: She exhibits no edema.  Neurological: She is alert and oriented to person, place, and time.  Skin: Skin is warm and dry.  Psychiatric: She has a normal mood and affect.   Filed Vitals:   01/08/15 1303  BP: 136/86  Pulse: 89  Temp: 98.4 F (36.9 C)  TempSrc: Oral  Resp: 16  Height: 5' 5.5" (1.664 m)  Weight: 275 lb (124.739 kg)  SpO2: 97%      Assessment & Plan:

## 2015-01-08 NOTE — Assessment & Plan Note (Signed)
Checking labs including HgA1c, CMP. Talked to her about the fact that exercise and dietary changes can sometimes prevent her pre-diabetes from becoming diabetes. She knows she needs to work on it but is not sure she is able to make changes today.

## 2015-01-08 NOTE — Patient Instructions (Signed)
We will check the blood work today and call you back even if everything is normal.   We will get the home sleep study to check for breathing problems while you sleep.   We have sent in the flonase and the eye drops for allergies that you can use.   Come back in 6-12 months for a check up or call us sooner if needed.   Diabetes and Exercise Exercising regularly is important. It is not just about losing weight. It has many health benefits, such as:  Improving your overall fitness, flexibility, and endurance.  Increasing your bone density.  Helping with weight control.  Decreasing your body fat.  Increasing your muscle strength.  Reducing stress and tension.  Improving your overall health. People with diabetes who exercise gain additional benefits because exercise:  Reduces appetite.  Improves the body's use of blood sugar (glucose).  Helps lower or control blood glucose.  Decreases blood pressure.  Helps control blood lipids (such as cholesterol and triglycerides).  Improves the body's use of the hormone insulin by:  Increasing the body's insulin sensitivity.  Reducing the body's insulin needs.  Decreases the risk for heart disease because exercising:  Lowers cholesterol and triglycerides levels.  Increases the levels of good cholesterol (such as high-density lipoproteins [HDL]) in the body.  Lowers blood glucose levels. YOUR ACTIVITY PLAN  Choose an activity that you enjoy and set realistic goals. Your health care provider or diabetes educator can help you make an activity plan that works for you. Exercise regularly as directed by your health care provider. This includes:  Performing resistance training twice a week such as push-ups, sit-ups, lifting weights, or using resistance bands.  Performing 150 minutes of cardio exercises each week such as walking, running, or playing sports.  Staying active and spending no more than 90 minutes at one time being  inactive. Even short bursts of exercise are good for you. Three 10-minute sessions spread throughout the day are just as beneficial as a single 30-minute session. Some exercise ideas include:  Taking the dog for a walk.  Taking the stairs instead of the elevator.  Dancing to your favorite song.  Doing an exercise video.  Doing your favorite exercise with a friend. RECOMMENDATIONS FOR EXERCISING WITH TYPE 1 OR TYPE 2 DIABETES   Check your blood glucose before exercising. If blood glucose levels are greater than 240 mg/dL, check for urine ketones. Do not exercise if ketones are present.  Avoid injecting insulin into areas of the body that are going to be exercised. For example, avoid injecting insulin into:  The arms when playing tennis.  The legs when jogging.  Keep a record of:  Food intake before and after you exercise.  Expected peak times of insulin action.  Blood glucose levels before and after you exercise.  The type and amount of exercise you have done.  Review your records with your health care provider. Your health care provider will help you to develop guidelines for adjusting food intake and insulin amounts before and after exercising.  If you take insulin or oral hypoglycemic agents, watch for signs and symptoms of hypoglycemia. They include:  Dizziness.  Shaking.  Sweating.  Chills.  Confusion.  Drink plenty of water while you exercise to prevent dehydration or heat stroke. Body water is lost during exercise and must be replaced.  Talk to your health care provider before starting an exercise program to make sure it is safe for you. Remember, almost any type of  activity is better than none. Document Released: 09/05/2003 Document Revised: 10/30/2013 Document Reviewed: 11/22/2012 [facility] Patient Information 2015 [place], [place]. This information is not intended to replace advice given to you by your health care provider. Make sure you discuss any questions  you have with your health care provider.

## 2015-01-09 ENCOUNTER — Telehealth: Payer: Self-pay

## 2015-01-09 NOTE — Telephone Encounter (Signed)
PA is required for Pataday eye drops. Please advise, alternative medication or proceed with PA?

## 2015-01-10 MED ORDER — AZELASTINE HCL 0.05 % OP SOLN: 1.0000 [drp] | Freq: Two times a day (BID) | OPHTHALMIC | Status: DC

## 2015-01-10 NOTE — Telephone Encounter (Signed)
Sent in alternative.

## 2015-01-16 ENCOUNTER — Encounter: Payer: Self-pay | Admitting: Internal Medicine

## 2015-01-16 ENCOUNTER — Other Ambulatory Visit: Payer: 59

## 2015-01-16 ENCOUNTER — Telehealth: Payer: Self-pay | Admitting: Internal Medicine

## 2015-01-16 ENCOUNTER — Ambulatory Visit (INDEPENDENT_AMBULATORY_CARE_PROVIDER_SITE_OTHER): Payer: 59 | Admitting: Internal Medicine

## 2015-01-16 VITALS — BP 128/82 | HR 99 | Temp 98.1°F | Resp 18 | Wt 272.0 lb

## 2015-01-16 DIAGNOSIS — R3915 Urgency of urination: Secondary | ICD-10-CM

## 2015-01-16 DIAGNOSIS — R8290 Unspecified abnormal findings in urine: Secondary | ICD-10-CM

## 2015-01-16 LAB — POCT URINALYSIS DIPSTICK
Bilirubin, UA: NEGATIVE
Glucose, UA: NEGATIVE
KETONES UA: NEGATIVE
Nitrite, UA: NEGATIVE
Urobilinogen, UA: 0.2
pH, UA: 6

## 2015-01-16 MED ORDER — NITROFURANTOIN MONOHYD MACRO 100 MG PO CAPS: 100.0000 mg | ORAL_CAPSULE | Freq: Two times a day (BID) | ORAL | Status: DC

## 2015-01-16 NOTE — Patient Instructions (Signed)
Drink as much nondairy fluids as possible. Avoid spicy foods or alcohol as  these may aggravate the bladder. Do not take decongestants. Avoid narcotics if possible.

## 2015-01-16 NOTE — Telephone Encounter (Signed)
Talked with [facility] and gave telephone order for macrobid

## 2015-01-16 NOTE — Progress Notes (Signed)
° °  Subjective:    Patient ID: Melinda Prince, female    DOB: 1971-05-01, 44 y.o.   MRN: [id number]  HPI Her symptoms began 01/14/15 as pressure with urination. She was experiencing urgency and oliguria. As of 7/19 she had dull red discoloration to her urine. She's had sweats but this is a chronic unrelated issue  Remotely she's had one urinary tract infection. She is a "prediabetic".   She has no history of genitourinary anomaly or procedures. She's not taken antibiotics recently.  Review of Systems She denies dysuria, pyuria, flank pain, vaginal discharge, fever, chills, or polyuria.    Objective:   Physical Exam  General appearance is one of good health and nourishment w/o distress.BMI: 44.56  Eyes: No conjunctival inflammation or scleral icterus is present.  Oral exam: Dental hygiene is good; lips and gums are healthy appearing.There is no oropharyngeal erythema or exudate noted.   Heart:  Normal rate and regular rhythm. S1 and S2 normal without gallop, murmur, click, rub or other extra sounds     Lungs:Chest clear to auscultation; no wheezes, rhonchi,rales ,or rubs present.No increased work of breathing.   Abdomen: bowel sounds normal, soft and non-tender without masses, organomegaly or hernias noted.  No guarding or rebound . No tenderness over the flanks to percussion  Musculoskeletal: Able to lie flat and sit up without help. Negative straight leg raising bilaterally. Gait normal  Skin:Warm & dry.  Intact without suspicious lesions or rashes ; no jaundice   Lymphatic: No lymphadenopathy is noted about the head, neck, axilla.              Assessment & Plan:  #1 urgency of urination.  #2 oliguria.  #3 abnormal urinalysis with 3+ leukocytes.  See orders and recommendations

## 2015-01-16 NOTE — Telephone Encounter (Signed)
Patient called to advise that [facility] [address] did not receive rx for macrobid. She spoke to them about 5 mins ago. Please call [facility] to follow up. Script reads that it was phoned in.

## 2015-01-16 NOTE — Progress Notes (Signed)
Pre visit review using our clinic review tool, if applicable. No additional management support is needed unless otherwise documented below in the visit note. °

## 2015-01-19 LAB — URINE CULTURE

## 2015-02-11 DIAGNOSIS — G4733 Obstructive sleep apnea (adult) (pediatric): Secondary | ICD-10-CM | POA: Diagnosis not present

## 2015-02-13 DIAGNOSIS — G4733 Obstructive sleep apnea (adult) (pediatric): Secondary | ICD-10-CM | POA: Diagnosis not present

## 2015-02-14 ENCOUNTER — Other Ambulatory Visit: Payer: Self-pay | Admitting: *Deleted

## 2015-02-14 DIAGNOSIS — R7303 Prediabetes: Secondary | ICD-10-CM

## 2015-02-18 ENCOUNTER — Telehealth: Payer: Self-pay | Admitting: Pulmonary Disease

## 2015-02-18 NOTE — Telephone Encounter (Signed)
Pt never seen in office. HST done 02/11/15. Requesting results. Please advise Dr. Elsworth Soho thanks

## 2015-02-18 NOTE — Telephone Encounter (Signed)
From [facility] Patient calling about HST Results. She has not heard anything. HST done 02/11/15

## 2015-02-20 NOTE — Telephone Encounter (Signed)
Pt informed of results. Nothing further needed.

## 2015-02-20 NOTE — Telephone Encounter (Signed)
Dr. Elsworth Soho,  [place] Clap report in Epic Please advise.

## 2015-02-20 NOTE — Telephone Encounter (Signed)
No evidence of OSA Defer to PCP

## 2015-02-20 NOTE — Telephone Encounter (Signed)
LMTC x 1

## 2015-03-25 ENCOUNTER — Encounter (HOSPITAL_COMMUNITY): Payer: Self-pay | Admitting: Emergency Medicine

## 2015-03-25 ENCOUNTER — Emergency Department (HOSPITAL_COMMUNITY)
Admission: EM | Admit: 2015-03-25 | Discharge: 2015-03-25 | Discharge status: Against Medical Advice | Payer: 59 | Attending: Emergency Medicine | Admitting: Emergency Medicine

## 2015-03-25 DIAGNOSIS — L299 Pruritus, unspecified: Secondary | ICD-10-CM | POA: Diagnosis present

## 2015-03-25 NOTE — ED Notes (Signed)
Pt. reports itching at hands and feet onset this evening unrelieved by OTC Benadryl at 10 pm .  Airway intact / no tongue swelling , respirations unlabored .

## 2015-03-25 NOTE — ED Notes (Signed)
Pt's states that she feels better after Benadryl and that she is going to go home and doesn't want to stay. Pt encouraged to stay but she said if it gets worse she will come back

## 2015-06-10 ENCOUNTER — Other Ambulatory Visit: Payer: Self-pay | Admitting: Physician Assistant

## 2015-06-10 ENCOUNTER — Telehealth: Payer: Self-pay | Admitting: Internal Medicine

## 2015-06-10 NOTE — Telephone Encounter (Signed)
Pt requesting refills for albuterol (PROVENTIL) (2.5 MG/3ML) 0.083% nebulizer solution 2.5 mg  [id number], beclomethasone (QVAR) 40 MCG/ACT inhaler [id number] and chlorpheniramine-HYDROcodone Amanda Cockayne [facility] ER) 10-8 MG/5ML Gaspar Skeeters [id number] DISCONTINUED  Pharmacy is [name] She is not feeling well and is having a hard time breathing

## 2015-06-10 NOTE — Telephone Encounter (Signed)
She may need a visit as none of these medicines are on her list. Please schedule acute for sickness.

## 2015-06-10 NOTE — Telephone Encounter (Signed)
Pt has an appt with Marya Amsler tomorrow.

## 2015-06-11 ENCOUNTER — Ambulatory Visit: Payer: 59 | Admitting: Family

## 2015-06-11 NOTE — Telephone Encounter (Signed)
Informed pt on vm °

## 2015-08-07 ENCOUNTER — Ambulatory Visit: Payer: Self-pay | Admitting: Internal Medicine

## 2015-08-16 ENCOUNTER — Encounter: Payer: Self-pay | Admitting: Family

## 2015-08-16 ENCOUNTER — Ambulatory Visit (INDEPENDENT_AMBULATORY_CARE_PROVIDER_SITE_OTHER): Payer: PRIVATE HEALTH INSURANCE | Admitting: Family

## 2015-08-16 VITALS — BP 136/84 | HR 86 | Temp 97.8°F | Resp 16 | Ht 65.05 in | Wt 257.0 lb

## 2015-08-16 DIAGNOSIS — M25561 Pain in right knee: Secondary | ICD-10-CM | POA: Diagnosis not present

## 2015-08-16 DIAGNOSIS — R059 Cough, unspecified: Secondary | ICD-10-CM | POA: Insufficient documentation

## 2015-08-16 DIAGNOSIS — R05 Cough: Secondary | ICD-10-CM

## 2015-08-16 MED ORDER — NAPROXEN-ESOMEPRAZOLE 500-20 MG PO TBEC: 1.0000 | DELAYED_RELEASE_TABLET | Freq: Two times a day (BID) | ORAL | Status: DC

## 2015-08-16 MED ORDER — DICLOFENAC SODIUM 2 % TD SOLN: 1.0000 "application " | Freq: Every day | TRANSDERMAL | Status: DC

## 2015-08-16 MED ORDER — FLUTICASONE FUROATE-VILANTEROL 100-25 MCG/INH IN AEPB: 1.0000 | INHALATION_SPRAY | Freq: Every day | RESPIRATORY_TRACT | Status: DC

## 2015-08-16 NOTE — Progress Notes (Signed)
Pre visit review using our clinic review tool, if applicable. No additional management support is needed unless otherwise documented below in the visit note.

## 2015-08-16 NOTE — Assessment & Plan Note (Signed)
Symptoms and exam with concern for reactive airway disease. Continue current dosage of albuterol. Start Breo. Follow-up in one week by email to determine effectiveness of controlling symptoms. Consider possible pulmonary function testing if symptoms are well controlled or prednisone if continues to experience worsening symptoms.

## 2015-08-16 NOTE — Progress Notes (Signed)
Subjective:    Patient ID: Melinda Prince, female    DOB: 1971/03/26, 45 y.o.   MRN: [id number]  Chief Complaint  Patient presents with   Cough    has a cough and sore throat that keep coming and going, some wheezing occurs too, she will use an inhaler and it will all go away for a bit then come back, has a issue with her right knee, has 2 places on it that bother her    HPI:  Melinda Prince is a 45 y.o. female who  has a past medical history of Pre-diabetes and UTI (lower urinary tract infection). and presents today for an acute office visit.  1.) Cough - Associated symptom of a cough has been going on for a few months. Notes she works in [employer] and the doors are open making the atmosphere cold. No fevers or shortness of breath. Does have wheezing which is modified with the albuterol which does improve the symptoms. Previously evaluated in Urgent Care where a chest x-ray was negative and she was believed to have a reactive airway concern. Course of her symptoms waxes and wanes and keeps coming back.   2.) Right knee pain - This is a new problem. Associated symptom of pain located in her anterior right knee has been going on for a couple of months. Pain is achey and described as if it is going to pop. Denies trauma. She does stand at work for long periods. Timing of symptoms is worse after work. Modifying factors include ice and elevation which has helped make her symptoms manageable.  No Known Allergies   Current Outpatient Prescriptions on File Prior to Visit  Medication Sig Dispense Refill   azelastine (OPTIVAR) 0.05 % ophthalmic solution Place 1 drop into both eyes 2 (two) times daily. 6 mL 12   Cholecalciferol (VITAMIN D) 2000 UNITS CAPS Take 1 capsule by mouth daily.     fluticasone (FLONASE) 50 MCG/ACT nasal spray Place 2 sprays into both nostrils daily. 16 g 6   nitrofurantoin, macrocrystal-monohydrate, (MACROBID) 100 MG capsule Take 1 capsule (100 mg  total) by mouth 2 (two) times daily. 14 capsule 0   Olopatadine HCl 0.2 % SOLN Apply 2 drops to eye as needed (eye itching, pain). 2.5 mL 11   Prenatal Vit-Fe Fumarate-FA (MULTIVITAMIN-PRENATAL) 27-0.8 MG TABS tablet Take 1 tablet by mouth daily at 12 noon.     QVAR 40 MCG/ACT inhaler INHALE TWO PUFFS BY MOUTH TWICE DAILY 1 Inhaler 0   traZODone (DESYREL) 50 MG tablet Take 0.5-1 tablets (25-50 mg total) by mouth at bedtime as needed for sleep. 30 tablet 3   VENTOLIN HFA 108 (90 BASE) MCG/ACT inhaler USE 2 PUFFS EVERY 4 HOURS AS NEEDED FOR WHEEZING OR SHORTNESS OF  BREATH 1 Inhaler 0   No current facility-administered medications on file prior to visit.    Past Medical History  Diagnosis Date   Pre-diabetes    UTI (lower urinary tract infection)       Review of Systems  Constitutional: Negative for fever and chills.  Respiratory: Positive for cough and wheezing. Negative for chest tightness and shortness of breath.   Musculoskeletal:       Positive for right knee pain.  Neurological: Negative for weakness and numbness.      Objective:    BP 136/84 mmHg   Pulse 86   Temp(Src) 97.8 F (36.6 C) (Oral)   Resp 16   Ht 5' 5.05" (1.652 m)   Wt 257  lb (116.574 kg)   BMI 42.72 kg/m2   SpO2 98% Nursing note and vital signs reviewed.  Physical Exam  Constitutional: She is oriented to person, place, and time. She appears well-developed and well-nourished. No distress.  Cardiovascular: Normal rate, regular rhythm, normal heart sounds and intact distal pulses.   Pulmonary/Chest: Effort normal and breath sounds normal.  Musculoskeletal:  Right knee pain - no obvious deformity, discoloration, or edema noted. Anterior knee pain around the patellar tendon and prepatellar bursa. Range of motion is within normal limits and no discomfort. Strength is 4-5+. Distal pulses and sensation are intact and appropriate.  Neurological: She is alert and oriented to person, place, and time.  Skin: Skin is  warm and dry.  Psychiatric: She has a normal mood and affect. Her behavior is normal. Judgment and thought content normal.       Assessment & Plan:   Problem List Items Addressed This Visit      Other   Right knee pain - Primary    Symptoms and exam consistent with possible bursitis. Treat conservatively with ice and compression. Start Pennsaid. Continue over the counter anti-inflammatories. Initiate home exercise therapy. Also discussed weight loss to help with symptoms.  Follow up in 3 weeks.       Relevant Medications   Diclofenac Sodium (PENNSAID) 2 % SOLN   Naproxen-Esomeprazole (VIMOVO) 500-20 MG TBEC   Cough    Symptoms and exam with concern for reactive airway disease. Continue current dosage of albuterol. Start Breo. Follow-up in one week by email to determine effectiveness of controlling symptoms. Consider possible pulmonary function testing if symptoms are well controlled or prednisone if continues to experience worsening symptoms.      Relevant Medications   fluticasone furoate-vilanterol (BREO ELLIPTA) 100-25 MCG/INH AEPB

## 2015-08-16 NOTE — Assessment & Plan Note (Signed)
Symptoms and exam consistent with possible bursitis. Treat conservatively with ice and compression. Start Pennsaid. Continue over the counter anti-inflammatories. Initiate home exercise therapy. Also discussed weight loss to help with symptoms.  Follow up in 3 weeks.

## 2015-08-16 NOTE — Patient Instructions (Signed)
Thank you for choosing [facility].  Summary/Instructions:  Please ice 2-3 times per day and after work or as needed. Over-the-counter anti-inflammatories as needed for discomfort. Recommend compression with a knee sleeve. Home exercises daily.  Start Breo daily for 1 week and please follow up via MyChart in 1 week.   Your prescription(s) have been submitted to your pharmacy or been printed and provided for you. Please take as directed and contact our office if you believe you are having problem(s) with the medication(s) or have any questions.  If your symptoms worsen or fail to improve, please contact our office for further instruction, or in case of emergency go directly to the emergency room at the closest medical facility.    Prepatellar Bursitis With Rehab  Bursitis is a condition that is characterized by inflammation of a bursa. Saunders Revel exists in many areas of the body. They are fluid-filled sacs that lie between a soft tissue (skin, tendon, or ligament) and a bone, and they reduce friction between the structures as well as the stress placed on the soft tissue. Prepatellar bursitis is inflammation of the bursa that lies between the skin and the kneecap (patella). This condition often causes pain over the patella. SYMPTOMS   Pain, tenderness, and/or inflammation over the patella.  Pain that worsens with movement of the knee joint.  Decreased range of motion for the knee joint.  A crackling sound (crepitation) when the bursa is moved or touched.  Occasionally, painless swelling of the bursa.  Fever (when infected). CAUSES  Bursitis is caused by damage to the bursa, which results in an inflammatory response. Common mechanisms of injury include:  Direct trauma to the front of the knee.  Repetitive and/or stressful use of the knee. RISK INCREASES WITH:  Activities in which kneeling and/or falling on one's knees is likely (volleyball or football).  Repetitive and  stressful training, especially if it involves running on hills.  Improper training techniques, such as a sudden increase in the intensity, frequency, or duration of training.  Failure to warm up properly before activity.  Poor technique.  Artificial turf. PREVENTION   Avoid kneeling or falling on your knees.  Warm up and stretch properly before activity.  Allow for adequate recovery between workouts.  Maintain physical fitness:  Strength, flexibility, and endurance.  Cardiovascular fitness.  Learn and use proper technique. When possible, have a coach correct improper technique.  Wear properly fitted and padded protective equipment (knee pads). PROGNOSIS  If treated properly, then the symptoms of prepatellar bursitis usually resolve within 2 weeks. RELATED COMPLICATIONS   Recurrent symptoms that result in a chronic problem.  Prolonged healing time, if improperly treated or reinjured.  Limited range of motion.  Infection of bursa.  Chronic inflammation or scarring of bursa. TREATMENT  Treatment initially involves the use of ice and medication to help reduce pain and inflammation. The use of strengthening and stretching exercises may help reduce pain with activity, especially those of the quadriceps and hamstring muscles. These exercises may be performed at home or with referral to a therapist. Your caregiver may recommend knee pads when you return to playing sports, in order to reduce the stress on the prepatellar bursa. If symptoms persist despite treatment, then your caregiver may drain fluid out with a needle (aspirate) the bursa. If symptoms persist for greater than 6 months despite nonsurgical (conservative) treatment, then surgery may be recommended to remove the bursa.  MEDICATION  If pain medication is necessary, then nonsteroidal anti-inflammatory medications, such  as aspirin and ibuprofen, or other minor pain relievers, such as acetaminophen, are often  recommended.  Do not take pain medication for 7 days before surgery.  Prescription pain relievers may be given if deemed necessary by your caregiver. Use only as directed and only as much as you need.  Corticosteroid injections may be given by your caregiver. These injections should be reserved for the most serious cases, because they may only be given a certain number of times. HEAT AND COLD  Cold treatment (icing) relieves pain and reduces inflammation. Cold treatment should be applied for 10 to 15 minutes every 2 to 3 hours for inflammation and pain and immediately after any activity that aggravates your symptoms. Use ice packs or massage the area with a piece of ice (ice massage).  Heat treatment may be used prior to performing the stretching and strengthening activities prescribed by your caregiver, physical therapist, or athletic trainer. Use a heat pack or soak the injury in warm water. SEEK MEDICAL CARE IF:  Treatment seems to offer no benefit, or the condition worsens.  Any medications produce adverse side effects. EXERCISES RANGE OF MOTION (ROM) AND STRETCHING EXERCISES - Prepatellar Bursitis These exercises may help you when beginning to rehabilitate your injury. Your symptoms may resolve with or without further involvement from your physician, physical therapist or athletic trainer. While completing these exercises, remember:   Restoring tissue flexibility helps normal motion to return to the joints. This allows healthier, less painful movement and activity.  An effective stretch should be held for at least 30 seconds.  A stretch should never be painful. You should only feel a gentle lengthening or release in the stretched tissue. STRETCH - Hamstrings, Standing  Stand or sit and extend your right / left leg, placing your foot on a chair or foot stool  Keeping a slight arch in your low back and your hips straight forward.  Lead with your chest and lean forward at the waist  until you feel a gentle stretch in the back of your right / left knee or thigh. (When done correctly, this exercise requires leaning only a small distance.)  Hold this position for __________ seconds. Repeat __________ times. Complete this stretch __________ times per day. STRETCH - Quadriceps, Prone   Lie on your stomach on a firm surface, such as a bed or padded floor.  Bend your right / left knee and grasp your ankle. If you are unable to reach, your ankle or pant leg, use a belt around your foot to lengthen your reach.  Gently pull your heel toward your buttocks. Your knee should not slide out to the side. You should feel a stretch in the front of your thigh and/or knee.  Hold this position for __________ seconds. Repeat __________ times. Complete this stretch __________ times per day.  STRETCH - Hamstrings/Adductors, V-Sit   Sit on the floor with your legs extended in a large "V," keeping your knees straight.  With your head and chest upright, bend at your waist reaching for your right foot to stretch your left adductors.  You should feel a stretch in your left inner thigh. Hold for __________ seconds.  Return to the upright position to relax your leg muscles.  Continuing to keep your chest upright, bend straight forward at your waist to stretch your hamstrings.  You should feel a stretch behind both of your thighs and/or knees. Hold for __________ seconds.  Return to the upright position to relax your leg muscles.  Repeat  steps 2 through 4. Repeat __________ times. Complete this exercise __________ times per day.  STRENGTHENING EXERCISES - Prepatellar Bursitis  These exercises may help you when beginning to rehabilitate your injury. They may resolve your symptoms with or without further involvement from your physician, physical therapist or athletic trainer. While completing these exercises, remember:  Muscles can gain both the endurance and the strength needed for everyday  activities through controlled exercises.  Complete these exercises as instructed by your physician, physical therapist or athletic trainer. Progress the resistance and repetitions only as guided. STRENGTH - Quadriceps, Isometrics  Lie on your back with your right / left leg extended and your opposite knee bent.  Gradually tense the muscles in the front of your right / left thigh. You should see either your kneecap slide up toward your hip or increased dimpling just above the knee. This motion will push the back of the knee down toward the floor/mat/bed on which you are lying.  Hold the muscle as tight as you can without increasing your pain for __________ seconds.  Relax the muscles slowly and completely in between each repetition. Repeat __________ times. Complete this exercise __________ times per day.  STRENGTH - Quadriceps, Short Arcs   Lie on your back. Place a __________ inch towel roll under your knee so that the knee slightly bends.  Raise only your lower leg by tightening the muscles in the front of your thigh. Do not allow your thigh to rise.  Hold this position for __________ seconds. Repeat __________ times. Complete this exercise __________ times per day.  OPTIONAL ANKLE WEIGHTS: Begin with ____________________, but DO NOT exceed ____________________. Increase in1 lb/0.5 kg increments.  STRENGTH - Quadriceps, Straight Leg Raises  Quality counts! Watch for signs that the quadriceps muscle is working to insure you are strengthening the correct muscles and not "cheating" by substituting with healthier muscles.  Lay on your back with your right / left leg extended and your opposite knee bent.  Tense the muscles in the front of your right / left thigh. You should see either your kneecap slide up or increased dimpling just above the knee. Your thigh may even quiver.  Tighten these muscles even more and raise your leg 4 to 6 inches off the floor. Hold for __________  seconds.  Keeping these muscles tense, lower your leg.  Relax the muscles slowly and completely in between each repetition. Repeat __________ times. Complete this exercise __________ times per day.  STRENGTH - Quadriceps, Step-Ups   Use a thick book, step or step stool that is __________ inches tall.  Holding a wall or counter for balance only, not support.  Slowly step-up with your right / left foot, keeping your knee in line with your hip and foot. Do not allow your knee to bend so far that you cannot see your toes.  Slowly unlock your knee and lower yourself to the starting position. Your muscles, not gravity, should lower you. Repeat __________ times. Complete this exercise __________ times per day.   This information is not intended to replace advice given to you by your health care provider. Make sure you discuss any questions you have with your health care provider.   Document Released: 06/15/2005 Document Revised: 03/06/2015 Document Reviewed: 09/27/2008 Elsevier Interactive Patient Education [employer].

## 2015-08-23 ENCOUNTER — Telehealth: Payer: Self-pay | Admitting: Internal Medicine

## 2015-08-23 NOTE — Telephone Encounter (Signed)
If this is regarding the medicines for the knee pain she can refill if needed and take as needed for pain.

## 2015-08-23 NOTE — Telephone Encounter (Signed)
Pt called said that both meds are working and she wants to know if she is suppose to get a refill on these and keep taking them or what she should do?

## 2015-08-26 NOTE — Telephone Encounter (Signed)
Patient called back in  TO CLARIFY   Patient was referring to fluticasone furoate-vilanterol (BREO ELLIPTA) 100-25 MCG/INH AEPB [id number] . She states that she wants to make sure that she needs to continue to take it after this dose. The pain meds are doing fine, no problems or questions.

## 2015-08-26 NOTE — Telephone Encounter (Signed)
Patient aware.

## 2015-08-26 NOTE — Telephone Encounter (Signed)
Yes, just use as needed and okay to stop once done.

## 2015-08-26 NOTE — Telephone Encounter (Signed)
Left message for patient to call me back. I need to clarify what medications she is talking about in the below message.

## 2015-08-27 ENCOUNTER — Telehealth: Payer: Self-pay | Admitting: Internal Medicine

## 2015-08-27 ENCOUNTER — Other Ambulatory Visit: Payer: Self-pay | Admitting: Geriatric Medicine

## 2015-08-27 MED ORDER — FLUTICASONE PROPIONATE 50 MCG/ACT NA SUSP: 2.0000 | Freq: Every day | NASAL | Status: DC

## 2015-08-27 NOTE — Telephone Encounter (Signed)
Pt called in and would like a refill on her fluticasone (FLONASE) 50 MCG/ACT nasal spray  [facility]

## 2015-08-27 NOTE — Telephone Encounter (Signed)
Sent to [name]. Patient aware.

## 2015-09-13 ENCOUNTER — Telehealth: Payer: Self-pay | Admitting: Internal Medicine

## 2015-09-13 MED ORDER — BENZONATATE 100 MG PO CAPS: 100.0000 mg | ORAL_CAPSULE | Freq: Three times a day (TID) | ORAL | Status: DC | PRN

## 2015-09-13 NOTE — Telephone Encounter (Signed)
Patient aware.

## 2015-09-13 NOTE — Telephone Encounter (Signed)
Sent in [place], can take 1 TID prn.

## 2015-09-13 NOTE — Telephone Encounter (Signed)
Pt was in last month with a cough and its come back. She is requesting a prescription for some cough medicine. Does she need an appt?

## 2015-09-15 ENCOUNTER — Ambulatory Visit (INDEPENDENT_AMBULATORY_CARE_PROVIDER_SITE_OTHER): Payer: No Typology Code available for payment source | Admitting: Family Medicine

## 2015-09-15 VITALS — BP 100/64 | HR 83 | Temp 98.9°F | Resp 16 | Ht 65.0 in | Wt 250.0 lb

## 2015-09-15 DIAGNOSIS — J101 Influenza due to other identified influenza virus with other respiratory manifestations: Secondary | ICD-10-CM | POA: Diagnosis not present

## 2015-09-15 DIAGNOSIS — R6889 Other general symptoms and signs: Secondary | ICD-10-CM

## 2015-09-15 DIAGNOSIS — J9801 Acute bronchospasm: Secondary | ICD-10-CM | POA: Diagnosis not present

## 2015-09-15 DIAGNOSIS — R7302 Impaired glucose tolerance (oral): Secondary | ICD-10-CM

## 2015-09-15 LAB — POCT CBC
Granulocyte percent: 64.6 %G (ref 37–80)
HEMATOCRIT: 40.5 % (ref 37.7–47.9)
Hemoglobin: 13.7 g/dL (ref 12.2–16.2)
LYMPH, POC: 1.3 (ref 0.6–3.4)
MCH, POC: 26.9 pg — AB (ref 27–31.2)
MCHC: 33.9 g/dL (ref 31.8–35.4)
MCV: 79.4 fL — AB (ref 80–97)
MID (CBC): 0.3 (ref 0–0.9)
MPV: 8.4 fL (ref 0–99.8)
POC GRANULOCYTE: 2.9 (ref 2–6.9)
POC LYMPH %: 28.7 % (ref 10–50)
POC MID %: 6.7 % (ref 0–12)
Platelet Count, POC: 269 10*3/uL (ref 142–424)
RBC: 5.1 M/uL (ref 4.04–5.48)
RDW, POC: 15.5 %
WBC: 4.5 10*3/uL — AB (ref 4.6–10.2)

## 2015-09-15 LAB — GLUCOSE, POCT (MANUAL RESULT ENTRY): POC GLUCOSE: 102 mg/dL — AB (ref 70–99)

## 2015-09-15 LAB — POCT INFLUENZA A/B
Influenza A, POC: NEGATIVE
Influenza B, POC: POSITIVE — AB

## 2015-09-15 MED ORDER — ALBUTEROL SULFATE HFA 108 (90 BASE) MCG/ACT IN AERS: INHALATION_SPRAY | RESPIRATORY_TRACT | Status: DC

## 2015-09-15 NOTE — Patient Instructions (Signed)

## 2015-09-15 NOTE — Progress Notes (Signed)
Subjective:    Patient ID: Melinda Prince, female    DOB: [birth year], 45 y.o.   MRN: [id number]  09/15/2015  Cough and Generalized Body Aches   HPI This 45 y.o. female presents for evaluation of body aches, cough.  Onset four days ago.  Husband came home with the flu this week. Not sure if has fever; +sweats chronic.  +chills at nighttime.  +body aches.  +HA.  No ear pain.  No sore throat.  +nasal congestion.  No rhinorrhea; +significant cough; no sputum production; no SOB; mild wheezing.  No asthma but has wheezed in the past with an acute illness in 06/2014.  No v/d.  Taking Mucinex, Tessalon perles; Dr. Sharlet Salina called in these two medications on Friday.  Did not have flu vaccine this season.  No tobacco abuse.  Works at [employer].  Does not have glucometer.   Review of Systems  Constitutional: Positive for chills, diaphoresis and fatigue. Negative for fever.  HENT: Positive for congestion and rhinorrhea. Negative for ear pain, postnasal drip and sore throat.   Eyes: Negative for visual disturbance.  Respiratory: Positive for cough and wheezing. Negative for shortness of breath.   Cardiovascular: Negative for chest pain, palpitations and leg swelling.  Gastrointestinal: Negative for nausea, vomiting, abdominal pain, diarrhea and constipation.  Endocrine: Negative for cold intolerance, heat intolerance, polydipsia, polyphagia and polyuria.  Neurological: Positive for headaches. Negative for dizziness, tremors, seizures, syncope, facial asymmetry, speech difficulty, weakness, light-headedness and numbness.    Past Medical History  Diagnosis Date   Pre-diabetes    UTI (lower urinary tract infection)    Past Surgical History  Procedure Laterality Date   Tubal ligation     No Known Allergies  Social History   Social History   Marital Status: Married    Spouse Name: N/A   Number of Children: N/A   Years of Education: N/A   Occupational History   Not on  file.   Social History Main Topics   Smoking status: Never Smoker    Smokeless tobacco: Never Used   Alcohol Use: Yes     Comment: occ wine   Drug Use: No   Sexual Activity: Not on file   Other Topics Concern   Not on file   Social History Narrative   Family History  Problem Relation Age of Onset   Diabetes Mother    Heart disease Mother    Hypertension Mother    Diabetes Father    Multiple sclerosis Sister        Objective:    BP 100/64 mmHg   Pulse 83   Temp(Src) 98.9 F (37.2 C) (Oral)   Resp 16   Ht 5\' 5"  (1.651 m)   Wt 250 lb (113.399 kg)   BMI 41.60 kg/m2   SpO2 98% Physical Exam  Constitutional: She is oriented to person, place, and time. She appears well-developed and well-nourished. She appears ill. No distress.  HENT:  Head: Normocephalic and atraumatic.  Right Ear: Tympanic membrane, external ear and ear canal normal.  Left Ear: Tympanic membrane, external ear and ear canal normal.  Nose: Nose normal.  Mouth/Throat: Uvula is midline, oropharynx is clear and moist and mucous membranes are normal.  Eyes: Conjunctivae and EOM are normal. Pupils are equal, round, and reactive to light.  Neck: Normal range of motion. Neck supple. Carotid bruit is not present. No thyromegaly present.  Cardiovascular: Normal rate, regular rhythm, normal heart sounds and intact distal pulses.  Exam reveals no  gallop and no friction rub.   No murmur heard. Pulmonary/Chest: Effort normal. She has no wheezes. She has no rhonchi. She has rales in the left lower field.  Abdominal: Soft. Bowel sounds are normal. She exhibits no distension and no mass. There is no tenderness. There is no rebound and no guarding.  Lymphadenopathy:    She has no cervical adenopathy.  Neurological: She is alert and oriented to person, place, and time. No cranial nerve deficit.  Skin: Skin is warm and dry. No rash noted. She is not diaphoretic. No erythema. No pallor.  Psychiatric: She has a normal  mood and affect. Her behavior is normal.   Results for orders placed or performed in visit on 09/15/15  POCT CBC  Result Value Ref Range   WBC 4.5 (A) 4.6 - 10.2 K/uL   Lymph, poc 1.3 0.6 - 3.4   POC LYMPH PERCENT 28.7 10 - 50 %L   MID (cbc) 0.3 0 - 0.9   POC MID % 6.7 0 - 12 %M   POC Granulocyte 2.9 2 - 6.9   Granulocyte percent 64.6 37 - 80 %G   RBC 5.10 4.04 - 5.48 M/uL   Hemoglobin 13.7 12.2 - 16.2 g/dL   HCT, POC 40.5 37.7 - 47.9 %   MCV 79.4 (A) 80 - 97 fL   MCH, POC 26.9 (A) 27 - 31.2 pg   MCHC 33.9 31.8 - 35.4 g/dL   RDW, POC 15.5 %   Platelet Count, POC 269 142 - 424 K/uL   MPV 8.4 0 - 99.8 fL  POCT Influenza A/B  Result Value Ref Range   Influenza A, POC Negative Negative   Influenza B, POC Positive (A) Negative       Assessment & Plan:   1. Influenza B   2. Flu-like symptoms   3. Glucose intolerance (impaired glucose tolerance)   4. Bronchospasm    -New. -Outside 48 hour window for Tamiflu. -continue supportive care with rest, fluids, Ibuprofen and/or Tylenol. -continue Mucinex and Tessalon Perles. -add albuterol every six hours scheduled for one week and then PRN. -RTC for acute worsening or SOB. -OOW note provided.   Orders Placed This Encounter  Procedures   POCT CBC   POCT Influenza A/B   POCT glucose (manual entry)   Meds ordered this encounter  Medications   albuterol (VENTOLIN HFA) 108 (90 Base) MCG/ACT inhaler    Sig: USE 2 PUFFS EVERY 6 HOURS AS NEEDED FOR WHEEZING OR SHORTNESS OF  BREATH    Dispense:  1 Inhaler    Refill:  0    No Follow-up on file.    Neville Walston Elayne Guerin, M.D. [facility] [address] [place], [place]  [address] [phone] phone [phone] fax

## 2015-10-23 ENCOUNTER — Ambulatory Visit (INDEPENDENT_AMBULATORY_CARE_PROVIDER_SITE_OTHER): Payer: PRIVATE HEALTH INSURANCE | Admitting: Family

## 2015-10-23 VITALS — BP 130/90 | HR 88 | Temp 98.3°F | Ht 65.0 in | Wt 252.0 lb

## 2015-10-23 DIAGNOSIS — H6123 Impacted cerumen, bilateral: Secondary | ICD-10-CM

## 2015-10-23 NOTE — Progress Notes (Signed)
Pre visit review using our clinic review tool, if applicable. No additional management support is needed unless otherwise documented below in the visit note. 

## 2015-10-23 NOTE — Patient Instructions (Signed)
Do not use Q-tips in the ear. Use Hydrogen Peroxide or Debrox drops weekly as needed for cerumen build up. Never stick anything into the ear canal. Return to clinic if thin puss drains out or if foul odor comes from ear.

## 2015-10-23 NOTE — Progress Notes (Signed)
Subjective:    Patient ID: Melinda Prince, female    DOB: [birth year], 45 y.o.   MRN: [id number]   Melinda Prince is a 45 y.o. female who presents today for an acute visit.    HPI Comments: She presents for evaluation of Right ear pain for couple of days. Had been wearing ear plugs ayt work and when she took them out, 'she couldn't hear anymore.' Has tried peroxide with qtip and olive oil without resolve. Left ear is starting to feel stuffed up. Denies tinnitus.   Past Medical History  Diagnosis Date   Pre-diabetes    UTI (lower urinary tract infection)    Review of patient's allergies indicates no known allergies. Current Outpatient Prescriptions on File Prior to Visit  Medication Sig Dispense Refill   albuterol (VENTOLIN HFA) 108 (90 Base) MCG/ACT inhaler USE 2 PUFFS EVERY 6 HOURS AS NEEDED FOR WHEEZING OR SHORTNESS OF  BREATH 1 Inhaler 0   benzonatate (TESSALON) 100 MG capsule Take 1 capsule (100 mg total) by mouth 3 (three) times daily as needed for cough. 60 capsule 0   fluticasone (FLONASE) 50 MCG/ACT nasal spray Place 2 sprays into both nostrils daily. 16 g 6   No current facility-administered medications on file prior to visit.    Social History  Substance Use Topics   Smoking status: Never Smoker    Smokeless tobacco: Never Used   Alcohol Use: Yes     Comment: occ wine    Review of Systems  Constitutional: Negative for fever and chills.  HENT: Positive for ear pain and hearing loss. Negative for congestion, sinus pressure and sore throat.   Respiratory: Negative for cough, shortness of breath and wheezing.   Cardiovascular: Negative for chest pain and palpitations.  Gastrointestinal: Negative for nausea and vomiting.      Objective:    BP 130/90 mmHg   Pulse 88   Temp(Src) 98.3 F (36.8 C) (Oral)   Ht 5\' 5"  (1.651 m)   Wt 252 lb (114.306 kg)   BMI 41.93 kg/m2   SpO2 98%   Physical Exam  Constitutional: She appears well-developed and  well-nourished.  HENT:  Head: Normocephalic and atraumatic.  Right Ear: Hearing, tympanic membrane, external ear and ear canal normal. No drainage, swelling or tenderness. No foreign bodies. Tympanic membrane is not erythematous and not bulging. No middle ear effusion. No decreased hearing is noted.  Left Ear: Hearing, tympanic membrane, external ear and ear canal normal. No drainage, swelling or tenderness. No foreign bodies. Tympanic membrane is not erythematous and not bulging.  No middle ear effusion. No decreased hearing is noted.  Nose: Nose normal. No rhinorrhea. Right sinus exhibits no maxillary sinus tenderness and no frontal sinus tenderness. Left sinus exhibits no maxillary sinus tenderness and no frontal sinus tenderness.  Mouth/Throat: Uvula is midline, oropharynx is clear and moist and mucous membranes are normal. No oropharyngeal exudate, posterior oropharyngeal edema, posterior oropharyngeal erythema or tonsillar abscesses.  Cerumen impaction bilateral ears.   Eyes: Conjunctivae are normal.  Cardiovascular: Regular rhythm, normal heart sounds and normal pulses.   Pulmonary/Chest: Effort normal and breath sounds normal. She has no wheezes. She has no rhonchi. She has no rales.  Lymphadenopathy:       Head (right side): No submental, no submandibular, no tonsillar, no preauricular, no posterior auricular and no occipital adenopathy present.       Head (left side): No submental, no submandibular, no tonsillar, no preauricular, no posterior auricular and no occipital adenopathy present.  She has no cervical adenopathy.  Neurological: She is alert.  Skin: Skin is warm and dry.  Psychiatric: She has a normal mood and affect. Her speech is normal and behavior is normal. Thought content normal.  Vitals reviewed.    Bilateral cerumen impactions, resolved with multiple irrigation and manual removal.  After which the EAC's and TM's are clear.  Assessment & Plan:   1. Cerumen impaction,  bilateral  Advised patient that if hearing does not improve or become "unclogged", to return to clinic for further evaluation.  I am having [name] maintain her fluticasone, benzonatate, and albuterol.   Start medications as prescribed and explained to patient on After Visit Summary ( AVS). Risks, benefits, and alternatives of the medications and treatment plan prescribed today were discussed, and patient expressed understanding.   Education regarding symptom management and diagnosis given to patient.   Follow-up:Plan follow-up as discussed or as needed if any worsening symptoms or change in condition. No Follow-up on file.   Continue to follow with Hoyt Koch, MD for routine health maintenance.   Melinda Prince and I agreed with plan.   Mable Paris, FNP

## 2015-12-17 ENCOUNTER — Ambulatory Visit (INDEPENDENT_AMBULATORY_CARE_PROVIDER_SITE_OTHER): Payer: PRIVATE HEALTH INSURANCE

## 2015-12-17 DIAGNOSIS — Z111 Encounter for screening for respiratory tuberculosis: Secondary | ICD-10-CM

## 2015-12-19 LAB — TB SKIN TEST
INDURATION: 0 mm
TB SKIN TEST: NEGATIVE

## 2015-12-25 LAB — HM PAP SMEAR

## 2016-01-09 ENCOUNTER — Ambulatory Visit: Payer: 59 | Admitting: Internal Medicine

## 2016-01-30 ENCOUNTER — Other Ambulatory Visit (INDEPENDENT_AMBULATORY_CARE_PROVIDER_SITE_OTHER): Payer: PRIVATE HEALTH INSURANCE

## 2016-01-30 ENCOUNTER — Ambulatory Visit (INDEPENDENT_AMBULATORY_CARE_PROVIDER_SITE_OTHER): Payer: PRIVATE HEALTH INSURANCE | Admitting: Internal Medicine

## 2016-01-30 ENCOUNTER — Encounter: Payer: Self-pay | Admitting: Internal Medicine

## 2016-01-30 VITALS — BP 122/88 | HR 77 | Temp 97.9°F | Resp 12 | Ht 65.5 in | Wt 253.8 lb

## 2016-01-30 DIAGNOSIS — R200 Anesthesia of skin: Secondary | ICD-10-CM

## 2016-01-30 DIAGNOSIS — R208 Other disturbances of skin sensation: Secondary | ICD-10-CM | POA: Diagnosis not present

## 2016-01-30 DIAGNOSIS — R202 Paresthesia of skin: Secondary | ICD-10-CM

## 2016-01-30 LAB — T4, FREE: FREE T4: 0.91 ng/dL (ref 0.60–1.60)

## 2016-01-30 LAB — TSH: TSH: 1.48 u[IU]/mL (ref 0.35–4.50)

## 2016-01-30 LAB — VITAMIN B12: VITAMIN B 12: 232 pg/mL (ref 211–911)

## 2016-01-30 NOTE — Progress Notes (Signed)
° °  Subjective:    Patient ID: Melinda Prince, female    DOB: July 15, 1970, 45 y.o.   MRN: [id number]  HPI The patient is a 45 YO female coming in for left arm numbness. 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 She is having some numb sensation for several weeks now. Sometimes gets some weakness in the hand suddenly. Worse when she is working but sometimes out of nowhere. Recently started on vitamin D replacement after her physical with her gyn and her HgA1c was 6.0.   Review of Systems  Constitutional: Negative for activity change, appetite change, fatigue, fever and unexpected weight change.  Respiratory: Negative for cough, chest tightness, shortness of breath and wheezing.   Cardiovascular: Negative for chest pain, palpitations and leg swelling.  Gastrointestinal: Negative for abdominal distention, abdominal pain, constipation, diarrhea and nausea.  Musculoskeletal: Positive for arthralgias and myalgias.  Skin: Negative.   Neurological: Positive for numbness.      Objective:   Physical Exam  Constitutional: She is oriented to person, place, and time. She appears well-developed and well-nourished.  Overweight  HENT:  Head: Normocephalic and atraumatic.  Eyes: EOM are normal.  Neck: Normal range of motion.  Cardiovascular: Normal rate and regular rhythm.   Pulmonary/Chest: Effort normal and breath sounds normal. No respiratory distress. She has no wheezes. She has no rales.  Abdominal: Soft. She exhibits no distension. There is no tenderness. There is no rebound.  Musculoskeletal: She exhibits no edema.  No pain in the shoulder or back, tinnel positive and no difference in strength left to right arm and hand  Neurological: She is alert and oriented to person, place, and time.  Skin: Skin is warm and dry.  Psychiatric: She has a normal mood and affect.   Vitals:   01/30/16 1032  BP: 122/88  Pulse: 77  Resp: 12  Temp: 97.9 F (36.6 C)  TempSrc: Oral   SpO2: 99%  Weight: 253 lb 12.8 oz (115.1 kg)  Height: 5' 5.5" (1.664 m)      Assessment & Plan:

## 2016-01-30 NOTE — Progress Notes (Signed)
Pre visit review using our clinic review tool, if applicable. No additional management support is needed unless otherwise documented below in the visit note.

## 2016-01-30 NOTE — Patient Instructions (Signed)
We are checking the blood work today and will call you back with the results.   You can try using the pennsaid on the area of your arm and use a wrist brace over the counter for carpal tunnel to see if it helps.   We will get you in with the neurologist to find the spot the nerve is being bothered and you will get a call about that.

## 2016-01-30 NOTE — Assessment & Plan Note (Signed)
No signs of diabetes in labs, checking thyroid and B12, recent vitamin D low and she is currently being replaced. Referral to neurology for nerve testing. She will try the pennsaid on the area and try the wrist splint at night time.

## 2016-01-31 ENCOUNTER — Telehealth: Payer: Self-pay | Admitting: Emergency Medicine

## 2016-01-31 NOTE — Telephone Encounter (Signed)
Do you think seeing Dr. Tamala Julian until she can get in with neuro would be an option? Please advise, thanks

## 2016-01-31 NOTE — Telephone Encounter (Signed)
Pt called and stated you wanted her to see the neurologist for the pain in her arm. They stated they cant see her until the middle of Oct. She wants to know if it is ok to wait that long? Please follow up thanks.

## 2016-02-04 NOTE — Telephone Encounter (Signed)
It is okay to wait that long, if she is having worsening symptoms can return to [place] before then.

## 2016-02-04 NOTE — Telephone Encounter (Signed)
Left message on voice mail informing patient.

## 2016-03-06 ENCOUNTER — Other Ambulatory Visit: Payer: Self-pay | Admitting: Family

## 2016-03-06 DIAGNOSIS — M25561 Pain in right knee: Secondary | ICD-10-CM

## 2016-05-01 ENCOUNTER — Encounter: Payer: Self-pay | Admitting: Family Medicine

## 2016-05-01 ENCOUNTER — Ambulatory Visit (INDEPENDENT_AMBULATORY_CARE_PROVIDER_SITE_OTHER): Payer: BLUE CROSS/BLUE SHIELD | Admitting: Family Medicine

## 2016-05-01 VITALS — BP 124/80 | HR 96 | Temp 97.9°F | Resp 12 | Ht 65.5 in | Wt 259.4 lb

## 2016-05-01 DIAGNOSIS — J029 Acute pharyngitis, unspecified: Secondary | ICD-10-CM | POA: Diagnosis not present

## 2016-05-01 LAB — POCT RAPID STREP A (OFFICE): RAPID STREP A SCREEN: NEGATIVE

## 2016-05-01 NOTE — Patient Instructions (Addendum)
°  [name] I have seen you today for an acute visit.  1. Acute pharyngitis, unspecified etiology  viral infections are self-limited and we treat each symptom depending of severity.   Tylenol and/or Ibuprofen also helps with most symptoms (headache, muscle aching, fever,etc) Plenty of fluids.  Monitor for new symptoms as: Cough or fever.  Please follow in not any better in 1-2 weeks or if symptoms get worse.  Symptomatic treatment: Over the counter Acetaminophen 500 mg and/or Ibuprofen (400-600 mg) if there is not contraindications; you can alternate in between both every 4-6 hours. Gargles with saline water and throat lozenges might also help. Cold fluids.    Seek prompt medical evaluation if you are having difficulty breathing, mouth swelling, throat closing up, not able to swallow liquids (drooling), skin rash/bruising, or worsening symptoms.  Please follow up in 2 weeks if not any better.       In general please monitor for signs of worsening symptoms and seek immediate medical attention if any concerning/warning symptom as we discussed.   If symptoms are not resolved in 7-14 days you should schedule a follow up appointment with your doctor, before if needed.  Please be sure you have an appointment already scheduled with your PCP before you leave today.

## 2016-05-01 NOTE — Progress Notes (Signed)
HPI:  ACUTE VISIT: Chief Complaint  Patient presents with   Sore Throat    started yesterday, muscle aches, no fever.    Melinda Prince is a 45 y.o.female here today complaining of 24 hours of sore throat.  Started on right pharyngeal side, exacerbated by swallowing.  Mild Nasal congestion and rhinorrhea. + Chills but no fever. She has not noted dysphagia, cough,chest pain, dyspnea,stridor, or wheezing.  No Hx of recent travel. Sick contact: Co-workers. No known insect bite.  Hx of allergies: Yes, allergic rhinitis, on Flonase nasal spray. Influenza vaccine over 6 weeks ago.  OTC medications for this problem: None  Symptoms otherwise stable.     Review of Systems  Constitutional: Positive for chills and fatigue. Negative for activity change, appetite change and fever.  HENT: Positive for congestion, rhinorrhea and sore throat. Negative for ear pain, mouth sores, postnasal drip, sinus pressure, trouble swallowing and voice change.   Eyes: Negative for discharge, redness and itching.  Respiratory: Negative for cough, shortness of breath and wheezing.   Cardiovascular: Negative for chest pain and leg swelling.  Gastrointestinal: Negative for abdominal pain, diarrhea, nausea and vomiting.  Musculoskeletal: Positive for myalgias. Negative for neck pain.  Skin: Negative for color change and rash.  Allergic/Immunologic: Positive for environmental allergies.  Neurological: Negative for weakness and headaches.  Hematological: Negative for adenopathy. Does not bruise/bleed easily.      Current Outpatient Prescriptions on File Prior to Visit  Medication Sig Dispense Refill   Diclofenac Sodium (PENNSAID) 2 % SOLN Place onto the skin.     VIMOVO 500-20 MG TBEC TAKE ONE TABLET BY MOUTH TWICE DAILY 60 tablet 0   No current facility-administered medications on file prior to visit.      Past Medical History:  Diagnosis Date   Pre-diabetes    UTI  (lower urinary tract infection)    No Known Allergies  Social History   Social History   Marital status: Married    Spouse name: N/A   Number of children: N/A   Years of education: N/A   Social History Main Topics   Smoking status: Never Smoker   Smokeless tobacco: Never Used   Alcohol use Yes     Comment: occ wine   Drug use: No   Sexual activity: Not Asked   Other Topics Concern   None   Social History Narrative   None    Vitals:   05/01/16 1418  BP: 124/80  Pulse: 96  Resp: 12  Temp: 97.9 F (36.6 C)   O2 sat 98% at RA   Body mass index is 42.51 kg/m.   Physical Exam  Nursing note and vitals reviewed. Constitutional: She is oriented to person, place, and time. She appears well-developed. She does not appear ill. No distress.  HENT:  Head: Atraumatic.  Right Ear: Tympanic membrane, external ear and ear canal normal.  Left Ear: Tympanic membrane, external ear and ear canal normal.  Nose: Rhinorrhea present. Right sinus exhibits no maxillary sinus tenderness and no frontal sinus tenderness. Left sinus exhibits no maxillary sinus tenderness and no frontal sinus tenderness.  Mouth/Throat: Uvula is midline and mucous membranes are normal. Posterior oropharyngeal erythema present. No oropharyngeal exudate or posterior oropharyngeal edema.  Hypertrophic turbinates.   Eyes: Conjunctivae are normal.  Neck: No edema and no erythema present.  Cardiovascular: Normal rate and regular rhythm.   No murmur heard. Respiratory: Effort normal and breath sounds normal. No stridor. No respiratory distress.  Lymphadenopathy:       Head (right side): No submandibular adenopathy present.       Head (left side): No submandibular adenopathy present.    She has cervical adenopathy.       Right cervical: Posterior cervical adenopathy present.       Left cervical: Posterior cervical adenopathy present.  Neurological: She is alert and oriented to person, place, and time.  She has normal strength. Gait normal.  Skin: Skin is warm. No rash noted. No erythema.  Psychiatric: She has a normal mood and affect. Her speech is normal.  Well groomed, good eye contact.      ASSESSMENT AND PLAN:     Melinda Prince was seen today for sore throat.  Diagnoses and all orders for this visit:  Acute pharyngitis, unspecified etiology -     POC Rapid Strep A -     Culture, Group A Strep   Symptoms suggests a viral etiology, I explained patient that symptomatic treatment is usually recommended in this case, so I do not think abx is needed at this time. Instructed to monitor for new symptoms or signs of complications, clearly instructed about warning signs. Note for work. Will follow strep Cx. F/U as needed.    -Melinda Prince advised to return or notify a doctor immediately if symptoms worsen or persist or new concerns arise.       Kelan Pritt G. [place], MD  [facility]. [place] office.

## 2016-05-01 NOTE — Progress Notes (Signed)
Pre visit review using our clinic review tool, if applicable. No additional management support is needed unless otherwise documented below in the visit note.

## 2016-05-03 LAB — CULTURE, GROUP A STREP: ORGANISM ID, BACTERIA: NORMAL

## 2016-05-08 ENCOUNTER — Telehealth: Payer: Self-pay | Admitting: *Deleted

## 2016-05-08 MED ORDER — ALBUTEROL SULFATE HFA 108 (90 BASE) MCG/ACT IN AERS: 2.0000 | INHALATION_SPRAY | Freq: Four times a day (QID) | RESPIRATORY_TRACT | 2 refills | Status: DC | PRN

## 2016-05-08 NOTE — Telephone Encounter (Signed)
Sent in albuterol

## 2016-05-08 NOTE — Telephone Encounter (Signed)
Notified pt rx sent to pharmacy...Melinda Prince

## 2016-05-08 NOTE — Telephone Encounter (Signed)
Left msg on triage requesting refill on rescue inhaler. She states was originally rx by Dr. Sherren Mocha...Johny Chess

## 2016-06-19 ENCOUNTER — Other Ambulatory Visit: Payer: Self-pay | Admitting: Family Medicine

## 2016-10-22 ENCOUNTER — Telehealth: Payer: Self-pay | Admitting: *Deleted

## 2016-10-22 DIAGNOSIS — M25561 Pain in right knee: Secondary | ICD-10-CM

## 2016-10-22 MED ORDER — DICLOFENAC SODIUM 2 % TD SOLN: 1.0000 [drp] | Freq: Two times a day (BID) | TRANSDERMAL | 0 refills | Status: DC | PRN

## 2016-10-22 MED ORDER — NAPROXEN-ESOMEPRAZOLE 500-20 MG PO TBEC: 1.0000 | DELAYED_RELEASE_TABLET | Freq: Two times a day (BID) | ORAL | 0 refills | Status: DC

## 2016-10-22 NOTE — Telephone Encounter (Signed)
Called pt back no answer LMOM w/MD response concerning ambien, sent other (2) meds to [place] left their contact # as  She requested...Melinda Prince

## 2016-10-22 NOTE — Telephone Encounter (Signed)
Left msg on triage requesting refill on the diclofenac, vimovo, and ambien. Also want [facility] # to call to inform them of new insurance...Johny Chess

## 2016-10-22 NOTE — Telephone Encounter (Signed)
Ambien has never been rxed by [place] and would require visit as it is controlled.

## 2017-01-28 ENCOUNTER — Ambulatory Visit (INDEPENDENT_AMBULATORY_CARE_PROVIDER_SITE_OTHER): Payer: PRIVATE HEALTH INSURANCE | Admitting: Nurse Practitioner

## 2017-01-28 ENCOUNTER — Other Ambulatory Visit (INDEPENDENT_AMBULATORY_CARE_PROVIDER_SITE_OTHER): Payer: PRIVATE HEALTH INSURANCE

## 2017-01-28 ENCOUNTER — Encounter: Payer: Self-pay | Admitting: Nurse Practitioner

## 2017-01-28 VITALS — BP 140/80 | HR 95 | Temp 98.9°F | Ht 65.5 in | Wt 278.0 lb

## 2017-01-28 DIAGNOSIS — R1013 Epigastric pain: Secondary | ICD-10-CM | POA: Diagnosis not present

## 2017-01-28 DIAGNOSIS — G4700 Insomnia, unspecified: Secondary | ICD-10-CM

## 2017-01-28 LAB — HEPATIC FUNCTION PANEL
ALBUMIN: 4 g/dL (ref 3.5–5.2)
ALT: 13 U/L (ref 0–35)
AST: 13 U/L (ref 0–37)
Alkaline Phosphatase: 53 U/L (ref 39–117)
Bilirubin, Direct: 0.1 mg/dL (ref 0.0–0.3)
TOTAL PROTEIN: 7.7 g/dL (ref 6.0–8.3)
Total Bilirubin: 0.3 mg/dL (ref 0.2–1.2)

## 2017-01-28 LAB — AMYLASE: Amylase: 46 U/L (ref 27–131)

## 2017-01-28 LAB — LIPASE: Lipase: 24 U/L (ref 11.0–59.0)

## 2017-01-28 MED ORDER — OMEPRAZOLE 20 MG PO CPDR: 20.0000 mg | DELAYED_RELEASE_CAPSULE | Freq: Two times a day (BID) | ORAL | 0 refills | Status: AC

## 2017-01-28 NOTE — Patient Instructions (Addendum)
Normal hepatic function and pancreatic enzymes.  If no improvement in 1week, will need ABD [place].  Avoid aspirin and NSAIDs.  Food Choices for Gastroesophageal Reflux Disease, Adult When you have gastroesophageal reflux disease (GERD), the foods you eat and your eating habits are very important. Choosing the right foods can help ease the discomfort of GERD. Consider working with a diet and nutrition specialist (dietitian) to help you make healthy food choices. What general guidelines should I follow? Eating plan  Choose healthy foods low in fat, such as fruits, vegetables, whole grains, low-fat dairy products, and lean meat, fish, and poultry.  Eat frequent, small meals instead of three large meals each day. Eat your meals slowly, in a relaxed setting. Avoid bending over or lying down until 2-3 hours after eating.  Limit high-fat foods such as fatty meats or fried foods.  Limit your intake of oils, butter, and shortening to less than 8 teaspoons each day.  Avoid the following: ? Foods that cause symptoms. These may be different for different people. Keep a food diary to keep track of foods that cause symptoms. ? Alcohol. ? Drinking large amounts of liquid with meals. ? Eating meals during the 2-3 hours before bed.  Cook foods using methods other than frying. This may include baking, grilling, or broiling. Lifestyle   Maintain a healthy weight. Ask your health care provider what weight is healthy for you. If you need to lose weight, work with your health care provider to do so safely.  Exercise for at least 30 minutes on 5 or more days each week, or as told by your health care provider.  Avoid wearing clothes that fit tightly around your waist and chest.  Do not use any products that contain nicotine or tobacco, such as cigarettes and e-cigarettes. If you need help quitting, ask your health care provider.  Sleep with the head of your bed raised. Use a wedge under the mattress or  blocks under the bed frame to raise the head of the bed. What foods are not recommended? The items listed may not be a complete list. Talk with your dietitian about what dietary choices are best for you. Grains Pastries or quick breads with added fat. [place] toast. Vegetables Deep fried vegetables. [place] fries. Any vegetables prepared with added fat. Any vegetables that cause symptoms. For some people this may include tomatoes and tomato products, chili peppers, onions and garlic, and horseradish. Fruits Any fruits prepared with added fat. Any fruits that cause symptoms. For some people this may include citrus fruits, such as oranges, grapefruit, pineapple, and lemons. Meats and other protein foods High-fat meats, such as fatty beef or pork, hot dogs, ribs, ham, sausage, salami and bacon. Fried meat or protein, including fried fish and fried chicken. Nuts and nut butters. Dairy Whole milk and chocolate milk. Sour cream. Cream. Ice cream. Cream cheese. Milk shakes. Beverages Coffee and tea, with or without caffeine. Carbonated beverages. Sodas. Energy drinks. Fruit juice made with acidic fruits (such as orange or grapefruit). Tomato juice. Alcoholic drinks. Fats and oils Butter. Margarine. Shortening. Ghee. Sweets and desserts Chocolate and cocoa. Donuts. Seasoning and other foods Pepper. Peppermint and spearmint. Any condiments, herbs, or seasonings that cause symptoms. For some people, this may include curry, hot sauce, or vinegar-based salad dressings. Summary  When you have gastroesophageal reflux disease (GERD), food and lifestyle choices are very important to help ease the discomfort of GERD.  Eat frequent, small meals instead of three large meals each day.  Eat your meals slowly, in a relaxed setting. Avoid bending over or lying down until 2-3 hours after eating.  Limit high-fat foods such as fatty meat or fried foods. This information is not intended to replace advice given to you  by your health care provider. Make sure you discuss any questions you have with your health care provider. Document Released: 06/15/2005 Document Revised: 06/16/2016 Document Reviewed: 06/16/2016 Elsevier Interactive Patient Education  2017 [employer].

## 2017-01-28 NOTE — Progress Notes (Signed)
Subjective:  Patient ID: Melinda Prince, female    DOB: 1970-08-04  Age: 46 y.o. MRN: [id number]  CC: GI Problem (discomfort in middle of the lower sternum-everytime she eats. )   GI Problem  The primary symptoms include abdominal pain. Primary symptoms do not include fever, weight loss, fatigue, nausea, vomiting, diarrhea, melena, hematemesis, jaundice, hematochezia, dysuria, myalgias or arthralgias. The illness began 6 to 7 days ago. The onset was sudden. The problem has not changed since onset. The illness is also significant for anorexia. The illness does not include chills, dysphagia, odynophagia, bloating, constipation, tenesmus, back pain or itching. Associated medical issues do not include GERD, gallstones, liver disease, alcohol abuse, PUD, irritable bowel syndrome, hemorrhoids or diverticulitis.   Epigastric pain with food intake. Current use of advil PM every night x 57month to help with sleep. No improvement with benadryl in past.  Outpatient Medications Prior to Visit  Medication Sig Dispense Refill   albuterol (PROVENTIL HFA;VENTOLIN HFA) 108 (90 Base) MCG/ACT inhaler Inhale 2 puffs into the lungs every 6 (six) hours as needed for wheezing or shortness of breath. 1 Inhaler 2   Diclofenac Sodium (PENNSAID) 2 % SOLN Apply 1 drop topically 2 (two) times daily as needed. 112 g 0   Naproxen-Esomeprazole (VIMOVO) 500-20 MG TBEC Take 1 tablet by mouth 2 (two) times daily. 60 tablet 0   No facility-administered medications prior to visit.     ROS See HPI  Objective:  BP 140/80    Pulse 95    Temp 98.9 F (37.2 C)    Ht 5' 5.5" (1.664 m)    Wt 278 lb (126.1 kg)    SpO2 98%    BMI 45.56 kg/m   BP Readings from Last 3 Encounters:  01/28/17 140/80  05/01/16 124/80  01/30/16 122/88    Wt Readings from Last 3 Encounters:  01/28/17 278 lb (126.1 kg)  05/01/16 259 lb 6 oz (117.7 kg)  01/30/16 253 lb 12.8 oz (115.1 kg)    Physical Exam  Constitutional: She is  oriented to person, place, and time. No distress.  Eyes: No scleral icterus.  Cardiovascular: Normal rate, regular rhythm and normal heart sounds.   Pulmonary/Chest: Effort normal and breath sounds normal.  Abdominal: Soft. She exhibits no distension and no mass. There is no rebound and no guarding.  Epigastric tenderness  Musculoskeletal:  Negative flank pain  Neurological: She is alert and oriented to person, place, and time.  Skin: Skin is warm and dry.  Vitals reviewed.   Lab Results  Component Value Date   WBC 4.5 (A) 09/15/2015   HGB 13.7 09/15/2015   HCT 40.5 09/15/2015   PLT 358.0 01/08/2015   GLUCOSE 72 01/08/2015   CHOL 170 01/08/2015   TRIG 158.0 (H) 01/08/2015   HDL 53.10 01/08/2015   LDLCALC 85 01/08/2015   ALT 13 01/28/2017   AST 13 01/28/2017   NA 140 01/08/2015   K 3.9 01/08/2015   CL 104 01/08/2015   CREATININE 0.87 01/08/2015   BUN 11 01/08/2015   CO2 28 01/08/2015   TSH 1.48 01/30/2016   HGBA1C 6.0 01/08/2015    Assessment & Plan:   Melinda Prince was seen today for gi problem.  Diagnoses and all orders for this visit:  Dyspepsia -     omeprazole (PRILOSEC) 20 MG capsule; Take 1 capsule (20 mg total) by mouth 2 (two) times daily before a meal. -     Lipase; Future -     Amylase; Future -  Hepatic function panel; Future  Insomnia, unspecified type   I have discontinued [name]'s Naproxen-Esomeprazole. I am also having her start on omeprazole. Additionally, I am having her maintain her albuterol, Diclofenac Sodium, and Melatonin.  Meds ordered this encounter  Medications   Melatonin 5 MG TABS    Sig: Take 2 tablets (10 mg total) by mouth at bedtime.    Dispense:  30 tablet    Refill:  0    Order Specific Question:   Supervising Provider    Answer:   Cassandria Anger [[id number]]   omeprazole (PRILOSEC) 20 MG capsule    Sig: Take 1 capsule (20 mg total) by mouth 2 (two) times daily before a meal.    Dispense:  60 capsule     Refill:  0    Order Specific Question:   Supervising Provider    Answer:   Cassandria Anger [[id number]]    Follow-up: No Follow-up on file.  Wilfred Lacy, NP

## 2017-07-14 ENCOUNTER — Telehealth: Payer: Self-pay | Admitting: Internal Medicine

## 2017-07-14 NOTE — Telephone Encounter (Signed)
Copied from [place]. Topic: Quick Communication - See Telephone Encounter >> Jul 14, 2017  2:36 PM Ivar Drape wrote: CRM for notification. See Telephone encounter for:  07/14/17. Patient would like a prescription for Zyrtec.  She said it does not matter if it's generic or not.

## 2017-07-15 ENCOUNTER — Telehealth: Payer: Self-pay | Admitting: Internal Medicine

## 2017-07-15 MED ORDER — CETIRIZINE HCL 10 MG PO TABS: 10.0000 mg | ORAL_TABLET | Freq: Every day | ORAL | 3 refills | Status: DC

## 2017-07-15 NOTE — Telephone Encounter (Signed)
Patient notified

## 2017-07-15 NOTE — Telephone Encounter (Signed)
Sent in

## 2017-07-15 NOTE — Telephone Encounter (Signed)
Rec'd from [facility] forwarded 3 pages to Dr. Pricilla Holm A

## 2017-07-22 ENCOUNTER — Telehealth: Payer: Self-pay | Admitting: Internal Medicine

## 2017-07-22 ENCOUNTER — Ambulatory Visit (INDEPENDENT_AMBULATORY_CARE_PROVIDER_SITE_OTHER): Payer: PRIVATE HEALTH INSURANCE | Admitting: Internal Medicine

## 2017-07-22 ENCOUNTER — Other Ambulatory Visit (INDEPENDENT_AMBULATORY_CARE_PROVIDER_SITE_OTHER): Payer: PRIVATE HEALTH INSURANCE

## 2017-07-22 ENCOUNTER — Encounter: Payer: Self-pay | Admitting: Internal Medicine

## 2017-07-22 VITALS — BP 140/90 | HR 97 | Temp 97.7°F | Ht 65.5 in | Wt 280.0 lb

## 2017-07-22 DIAGNOSIS — R059 Cough, unspecified: Secondary | ICD-10-CM

## 2017-07-22 DIAGNOSIS — R7303 Prediabetes: Secondary | ICD-10-CM | POA: Diagnosis not present

## 2017-07-22 DIAGNOSIS — E559 Vitamin D deficiency, unspecified: Secondary | ICD-10-CM | POA: Diagnosis not present

## 2017-07-22 DIAGNOSIS — Z0000 Encounter for general adult medical examination without abnormal findings: Secondary | ICD-10-CM | POA: Diagnosis not present

## 2017-07-22 DIAGNOSIS — Z23 Encounter for immunization: Secondary | ICD-10-CM

## 2017-07-22 DIAGNOSIS — R05 Cough: Secondary | ICD-10-CM

## 2017-07-22 LAB — COMPREHENSIVE METABOLIC PANEL
ALK PHOS: 54 U/L (ref 39–117)
ALT: 10 U/L (ref 0–35)
AST: 11 U/L (ref 0–37)
Albumin: 3.9 g/dL (ref 3.5–5.2)
BILIRUBIN TOTAL: 0.3 mg/dL (ref 0.2–1.2)
BUN: 13 mg/dL (ref 6–23)
CO2: 29 meq/L (ref 19–32)
Calcium: 9 mg/dL (ref 8.4–10.5)
Chloride: 105 mEq/L (ref 96–112)
Creatinine, Ser: 0.84 mg/dL (ref 0.40–1.20)
GFR: 92.75 mL/min (ref >60.00)
GLUCOSE: 118 mg/dL — AB (ref 70–99)
Potassium: 3.8 mEq/L (ref 3.5–5.1)
SODIUM: 141 meq/L (ref 135–145)
TOTAL PROTEIN: 7.5 g/dL (ref 6.0–8.3)

## 2017-07-22 LAB — CBC
HCT: 39.3 % (ref 36.0–46.0)
Hemoglobin: 12.7 g/dL (ref 12.0–15.0)
MCHC: 32.3 g/dL (ref 30.0–36.0)
MCV: 79.9 fl (ref 78.0–100.0)
Platelets: 345 10*3/uL (ref 150.0–400.0)
RBC: 4.92 Mil/uL (ref 3.87–5.11)
RDW: 15.6 % — AB (ref 11.5–15.5)
WBC: 7.9 10*3/uL (ref 4.0–10.5)

## 2017-07-22 LAB — HEMOGLOBIN A1C: HEMOGLOBIN A1C: 6.7 % — AB (ref 4.6–6.5)

## 2017-07-22 LAB — LIPID PANEL
CHOL/HDL RATIO: 4
Cholesterol: 189 mg/dL (ref 0–200)
HDL: 48.2 mg/dL (ref >39.00)
LDL Cholesterol: 119 mg/dL — ABNORMAL HIGH (ref 0–99)
NONHDL: 140.43
Triglycerides: 106 mg/dL (ref 0.0–149.0)
VLDL: 21.2 mg/dL (ref 0.0–40.0)

## 2017-07-22 LAB — VITAMIN D 25 HYDROXY (VIT D DEFICIENCY, FRACTURES): VITD: 20.74 ng/mL — AB (ref 30.00–100.00)

## 2017-07-22 LAB — VITAMIN B12: VITAMIN B 12: 276 pg/mL (ref 211–911)

## 2017-07-22 MED ORDER — LORCASERIN HCL ER 20 MG PO TB24: 20.0000 mg | ORAL_TABLET | Freq: Every day | ORAL | 3 refills | Status: DC

## 2017-07-22 MED ORDER — TRIAMCINOLONE ACETONIDE 0.1 % EX CREA: 1.0000 "application " | TOPICAL_CREAM | Freq: Two times a day (BID) | CUTANEOUS | 0 refills | Status: DC

## 2017-07-22 MED ORDER — PREDNISONE 20 MG PO TABS: 40.0000 mg | ORAL_TABLET | Freq: Every day | ORAL | 0 refills | Status: DC

## 2017-07-22 NOTE — Telephone Encounter (Signed)
Copied from [place] [phone]. Topic: Quick Communication - Rx Refill/Question >> Jul 22, 2017  2:20 PM Corie Chiquito, [place] wrote: Medication: Prednisone, Belveiq, and Cream for legs(doesn't know name of)   Has the patient contacted their pharmacy? Yes   Patient has been in contact with her pharmacy and still hasn't received the 3 medications yet. Was seen in the office on 07-22-2017    Preferred Pharmacy (with phone number or street name): [facility] [address] [phone]   Agent: Please be advised that RX refills may take up to 3 business days. We ask that you follow-up with your pharmacy.

## 2017-07-22 NOTE — Progress Notes (Signed)
° °  Subjective:    Patient ID: Melinda Prince, female    DOB: Aug 20, 1970, 48 y.o.   MRN: [id number]  HPI The patient is a 47 YO female coming in for physical. Several other concerns we are able to address.   PMH, [facility], social history reviewed and updated.   [place] [facility] diabetes management  Review of Systems  Constitutional: Positive for activity change, appetite change and chills. Negative for fatigue, fever and unexpected weight change.  HENT: Positive for congestion, postnasal drip, rhinorrhea and sinus pressure. Negative for ear discharge, ear pain, sinus pain, sneezing, sore throat, tinnitus, trouble swallowing and voice change.   Eyes: Negative.   Respiratory: Positive for cough. Negative for chest tightness, shortness of breath and wheezing.   Cardiovascular: Negative.  Negative for chest pain, palpitations and leg swelling.  Gastrointestinal: Negative.  Negative for abdominal distention, abdominal pain, constipation, diarrhea, nausea and vomiting.  Musculoskeletal: Negative for arthralgias, back pain and myalgias.  Skin: Negative.   Neurological: Negative.   Psychiatric/Behavioral: Negative.       Objective:   Physical Exam  Constitutional: She is oriented to person, place, and time. She appears well-developed and well-nourished.  HENT:  Head: Normocephalic and atraumatic.  Eyes: EOM are normal.  Neck: Normal range of motion.  Cardiovascular: Normal rate and regular rhythm.  Pulmonary/Chest: Effort normal. No respiratory distress. She has wheezes. She has no rales.  Abdominal: Soft. Bowel sounds are normal. She exhibits no distension. There is no tenderness. There is no rebound.  Musculoskeletal: She exhibits no edema.  Neurological: She is alert and oriented to person, place, and time. Coordination normal.  Skin: Skin is warm and dry.  Psychiatric: She has a normal mood and affect.   Vitals:   07/22/17 0801  BP: 140/90  Pulse: 97  Temp: 97.7 F (36.5  C)  TempSrc: Oral  SpO2: 100%  Weight: 280 lb (127 kg)  Height: 5' 5.5" (1.664 m)      Assessment & Plan:  Tdap given at visit

## 2017-07-22 NOTE — Telephone Encounter (Signed)
Sent in

## 2017-07-22 NOTE — Telephone Encounter (Signed)
Medication request; pt seen by Dr Sharlet Salina 07/22/17

## 2017-07-22 NOTE — Patient Instructions (Signed)
We have given you the tetanus shot today.  The weight loss medicines are belviq, contrave, qsymia.   Work on some self care activities such as journaling, meditation, exercise to help you work on you. You cannot take good care of others if you are not taking good care of you.  Health Maintenance, Female Adopting a healthy lifestyle and getting preventive care can go a long way to promote health and wellness. Talk with your health care provider about what schedule of regular examinations is right for you. This is a good chance for you to check in with your provider about disease prevention and staying healthy. In between checkups, there are plenty of things you can do on your own. Experts have done a lot of research about which lifestyle changes and preventive measures are most likely to keep you healthy. Ask your health care provider for more information. Weight and diet Eat a healthy diet  Be sure to include plenty of vegetables, fruits, low-fat dairy products, and lean protein.  Do not eat a lot of foods high in solid fats, added sugars, or salt.  Get regular exercise. This is one of the most important things you can do for your health. ? Most adults should exercise for at least 150 minutes each week. The exercise should increase your heart rate and make you sweat (moderate-intensity exercise). ? Most adults should also do strengthening exercises at least twice a week. This is in addition to the moderate-intensity exercise.  Maintain a healthy weight  Body mass index (BMI) is a measurement that can be used to identify possible weight problems. It estimates body fat based on height and weight. Your health care provider can help determine your BMI and help you achieve or maintain a healthy weight.  For females 44 years of age and older: ? A BMI below 18.5 is considered underweight. ? A BMI of 18.5 to 24.9 is normal. ? A BMI of 25 to 29.9 is considered overweight. ? A BMI of 30 and above  is considered obese.  Watch levels of cholesterol and blood lipids  You should start having your blood tested for lipids and cholesterol at 49 years of age, then have this test every 5 years.  You may need to have your cholesterol levels checked more often if: ? Your lipid or cholesterol levels are high. ? You are older than 49 years of age. ? You are at high risk for heart disease.  Cancer screening Lung Cancer  Lung cancer screening is recommended for adults 1-58 years old who are at high risk for lung cancer because of a history of smoking.  A yearly low-dose CT scan of the lungs is recommended for people who: ? Currently smoke. ? Have quit within the past 15 years. ? Have at least a 30-pack-year history of smoking. A pack year is smoking an average of one pack of cigarettes a day for 1 year.  Yearly screening should continue until it has been 15 years since you quit.  Yearly screening should stop if you develop a health problem that would prevent you from having lung cancer treatment.  Breast Cancer  Practice breast self-awareness. This means understanding how your breasts normally appear and feel.  It also means doing regular breast self-exams. Let your health care provider know about any changes, no matter how small.  If you are in your 20s or 30s, you should have a clinical breast exam (CBE) by a health care provider every 1-3 years as  part of a regular health exam.  If you are 40 or older, have a CBE every year. Also consider having a breast X-ray (mammogram) every year.  If you have a family history of breast cancer, talk to your health care provider about genetic screening.  If you are at high risk for breast cancer, talk to your health care provider about having an MRI and a mammogram every year.  Breast cancer gene (BRCA) assessment is recommended for women who have family members with BRCA-related cancers. BRCA-related cancers  include: ? Breast. ? Ovarian. ? Tubal. ? Peritoneal cancers.  Results of the assessment will determine the need for genetic counseling and BRCA1 and BRCA2 testing.  Cervical Cancer Your health care provider may recommend that you be screened regularly for cancer of the pelvic organs (ovaries, uterus, and vagina). This screening involves a pelvic examination, including checking for microscopic changes to the surface of your cervix (Pap test). You may be encouraged to have this screening done every 3 years, beginning at age 31.  For women ages 3-65, health care providers may recommend pelvic exams and Pap testing every 3 years, or they may recommend the Pap and pelvic exam, combined with testing for human papilloma virus (HPV), every 5 years. Some types of HPV increase your risk of cervical cancer. Testing for HPV may also be done on women of any age with unclear Pap test results.  Other health care providers may not recommend any screening for nonpregnant women who are considered low risk for pelvic cancer and who do not have symptoms. Ask your health care provider if a screening pelvic exam is right for you.  If you have had past treatment for cervical cancer or a condition that could lead to cancer, you need Pap tests and screening for cancer for at least 20 years after your treatment. If Pap tests have been discontinued, your risk factors (such as having a new sexual partner) need to be reassessed to determine if screening should resume. Some women have medical problems that increase the chance of getting cervical cancer. In these cases, your health care provider may recommend more frequent screening and Pap tests.  Colorectal Cancer  This type of cancer can be detected and often prevented.  Routine colorectal cancer screening usually begins at 49 years of age and continues through 49 years of age.  Your health care provider may recommend screening at an earlier age if you have risk factors  for colon cancer.  Your health care provider may also recommend using home test kits to check for hidden blood in the stool.  A small camera at the end of a tube can be used to examine your colon directly (sigmoidoscopy or colonoscopy). This is done to check for the earliest forms of colorectal cancer.  Routine screening usually begins at age 52.  Direct examination of the colon should be repeated every 5-10 years through 49 years of age. However, you may need to be screened more often if early forms of precancerous polyps or small growths are found.  Skin Cancer  Check your skin from head to toe regularly.  Tell your health care provider about any new moles or changes in moles, especially if there is a change in a mole's shape or color.  Also tell your health care provider if you have a mole that is larger than the size of a pencil eraser.  Always use sunscreen. Apply sunscreen liberally and repeatedly throughout the day.  Protect yourself by wearing  long sleeves, pants, a wide-brimmed hat, and sunglasses whenever you are outside.  Heart disease, diabetes, and high blood pressure  High blood pressure causes heart disease and increases the risk of stroke. High blood pressure is more likely to develop in: ? People who have blood pressure in the high end of the normal range (130-139/85-89 mm Hg). ? People who are overweight or obese. ? People who are African American.  If you are 39-60 years of age, have your blood pressure checked every 3-5 years. If you are 53 years of age or older, have your blood pressure checked every year. You should have your blood pressure measured twice--once when you are at a hospital or clinic, and once when you are not at a hospital or clinic. Record the average of the two measurements. To check your blood pressure when you are not at a hospital or clinic, you can use: ? An automated blood pressure machine at a pharmacy. ? A home blood pressure monitor.  If  you are between 26 years and 36 years old, ask your health care provider if you should take aspirin to prevent strokes.  Have regular diabetes screenings. This involves taking a blood sample to check your fasting blood sugar level. ? If you are at a normal weight and have a low risk for diabetes, have this test once every three years after 49 years of age. ? If you are overweight and have a high risk for diabetes, consider being tested at a younger age or more often. Preventing infection Hepatitis B  If you have a higher risk for hepatitis B, you should be screened for this virus. You are considered at high risk for hepatitis B if: ? You were born in a country where hepatitis B is common. Ask your health care provider which countries are considered high risk. ? Your parents were born in a high-risk country, and you have not been immunized against hepatitis B (hepatitis B vaccine). ? You have HIV or AIDS. ? You use needles to inject street drugs. ? You live with someone who has hepatitis B. ? You have had sex with someone who has hepatitis B. ? You get hemodialysis treatment. ? You take certain medicines for conditions, including cancer, organ transplantation, and autoimmune conditions.  Hepatitis C  Blood testing is recommended for: ? Everyone born from [birth year] through 1967. ? Anyone with known risk factors for hepatitis C.  Sexually transmitted infections (STIs)  You should be screened for sexually transmitted infections (STIs) including gonorrhea and chlamydia if: ? You are sexually active and are younger than 49 years of age. ? You are older than 49 years of age and your health care provider tells you that you are at risk for this type of infection. ? Your sexual activity has changed since you were last screened and you are at an increased risk for chlamydia or gonorrhea. Ask your health care provider if you are at risk.  If you do not have HIV, but are at risk, it may be recommended  that you take a prescription medicine daily to prevent HIV infection. This is called pre-exposure prophylaxis (PrEP). You are considered at risk if: ? You are sexually active and do not regularly use condoms or know the HIV status of your partner(s). ? You take drugs by injection. ? You are sexually active with a partner who has HIV.  Talk with your health care provider about whether you are at high risk of being infected with HIV.  If you choose to begin PrEP, you should first be tested for HIV. You should then be tested every 3 months for as long as you are taking PrEP. Pregnancy  If you are premenopausal and you may become pregnant, ask your health care provider about preconception counseling.  If you may become pregnant, take 400 to 800 micrograms (mcg) of folic acid every day.  If you want to prevent pregnancy, talk to your health care provider about birth control (contraception). Osteoporosis and menopause  Osteoporosis is a disease in which the bones lose minerals and strength with aging. This can result in serious bone fractures. Your risk for osteoporosis can be identified using a bone density scan.  If you are 33 years of age or older, or if you are at risk for osteoporosis and fractures, ask your health care provider if you should be screened.  Ask your health care provider whether you should take a calcium or vitamin D supplement to lower your risk for osteoporosis.  Menopause may have certain physical symptoms and risks.  Hormone replacement therapy may reduce some of these symptoms and risks. Talk to your health care provider about whether hormone replacement therapy is right for you. Follow these instructions at home:  Schedule regular health, dental, and eye exams.  Stay current with your immunizations.  Do not use any tobacco products including cigarettes, chewing tobacco, or electronic cigarettes.  If you are pregnant, do not drink alcohol.  If you are  breastfeeding, limit how much and how often you drink alcohol.  Limit alcohol intake to no more than 1 drink per day for nonpregnant women. One drink equals 12 ounces of beer, 5 ounces of wine, or 1 ounces of hard liquor.  Do not use street drugs.  Do not share needles.  Ask your health care provider for help if you need support or information about quitting drugs.  Tell your health care provider if you often feel depressed.  Tell your health care provider if you have ever been abused or do not feel safe at home. This information is not intended to replace advice given to you by your health care provider. Make sure you discuss any questions you have with your health care provider. Document Released: 12/29/2010 Document Revised: 11/21/2015 Document Reviewed: 03/19/2015 Elsevier Interactive Patient Education  [employer].

## 2017-07-23 DIAGNOSIS — Z0000 Encounter for general adult medical examination without abnormal findings: Secondary | ICD-10-CM | POA: Insufficient documentation

## 2017-07-23 NOTE — Telephone Encounter (Signed)
Notified pt refills sent to [facility]...Melinda Prince

## 2017-07-23 NOTE — Assessment & Plan Note (Signed)
Checking vitamin D level and adjust as needed.

## 2017-07-23 NOTE — Assessment & Plan Note (Addendum)
Tdap given at visit. Flu shot up to date. Colon screening due at 65. Pap smear up to date, mammogram up to date. Declines HIV screening. Counseled about sun safety and dangers of distracted driving. Given screening recommendations.

## 2017-07-23 NOTE — Assessment & Plan Note (Addendum)
BMI > 40 and complicated by GERD and pre-diabetes and osteoarthritis. She will be going to nutritionist soon. Rx for belviq.

## 2017-07-23 NOTE — Assessment & Plan Note (Signed)
Rx for prednisone as she is still having symptoms on the augmentin. Finish augmentin and add prednisone.

## 2017-07-23 NOTE — Assessment & Plan Note (Signed)
Checking HgA1c and adjust as needed.

## 2017-07-26 LAB — HM DIABETES EYE EXAM

## 2017-07-27 ENCOUNTER — Telehealth: Payer: Self-pay | Admitting: Internal Medicine

## 2017-07-27 DIAGNOSIS — R7303 Prediabetes: Secondary | ICD-10-CM

## 2017-07-27 NOTE — Telephone Encounter (Signed)
Copied from [place] [phone]. Topic: Referral - Request >> Jul 27, 2017 10:11 AM Percell Belt A wrote: Reason for CRM: [facility] called and said that they needs an order in epic for this pt in order to sch. Can an order be put in for this per office notes?

## 2017-07-27 NOTE — Telephone Encounter (Signed)
Placed referral

## 2017-07-29 ENCOUNTER — Telehealth: Payer: Self-pay

## 2017-07-29 DIAGNOSIS — E119 Type 2 diabetes mellitus without complications: Secondary | ICD-10-CM

## 2017-07-29 NOTE — Telephone Encounter (Signed)
Copied from [place]. Topic: General - Other >> Jul 28, 2017  3:19 PM Yvette Rack wrote: Reason for CRM: Pam from [facility] [phone] calling stating that the pt has an appt on 08-10-17 and the pt had told the Nutritionist  that her A1C was 6.7 and the referral stated pre DM but Pam said the DX code need to be changed because it says pre Diabetes  instead of Diabetes

## 2017-07-29 NOTE — Telephone Encounter (Signed)
Called pam and informed her the changes were made

## 2017-07-29 NOTE — Telephone Encounter (Signed)
Placed

## 2017-08-10 ENCOUNTER — Encounter: Payer: Self-pay | Admitting: Skilled Nursing Facility1

## 2017-08-10 ENCOUNTER — Encounter: Payer: PRIVATE HEALTH INSURANCE | Attending: Internal Medicine | Admitting: Skilled Nursing Facility1

## 2017-08-10 DIAGNOSIS — Z6841 Body Mass Index (BMI) 40.0 and over, adult: Secondary | ICD-10-CM | POA: Diagnosis not present

## 2017-08-10 DIAGNOSIS — Z713 Dietary counseling and surveillance: Secondary | ICD-10-CM | POA: Diagnosis not present

## 2017-08-10 DIAGNOSIS — E119 Type 2 diabetes mellitus without complications: Secondary | ICD-10-CM | POA: Diagnosis not present

## 2017-08-10 NOTE — Progress Notes (Signed)
Pts A1C 6.7. Pt states she has been feeling depressed for the last year resulting in stress and no sleep. Pt states on days she is feeling bad she eats candy, soda, sweet tea, desserts.  Diabetes Self-Management Education  Visit Type: First/Initial 08/10/2017  [name], identified by name and date of birth, is a 47 y.o. female with a diagnosis of Diabetes: Type 2.   ASSESSMENT  Height 5' 5.5" (1.664 m), weight 278 lb (126.1 kg). Body mass index is 45.56 kg/m.  Diabetes Self-Management Education - 08/10/17 0852      Visit Information   Visit Type  First/Initial      Initial Visit   Diabetes Type  Type 2    Are you currently following a meal plan?  No    Are you taking your medications as prescribed?  Not on Medications      Health Coping   How would you rate your overall health?  Good      Psychosocial Assessment   Patient Belief/Attitude about Diabetes  Afraid      Pre-Education Assessment   Patient understands the diabetes disease and treatment process.  Needs Instruction    Patient understands incorporating nutritional management into lifestyle.  Needs Instruction    Patient undertands incorporating physical activity into lifestyle.  Needs Instruction    Patient understands using medications safely.  Needs Instruction    Patient understands monitoring blood glucose, interpreting and using results  Needs Instruction    Patient understands prevention, detection, and treatment of acute complications.  Needs Instruction    Patient understands prevention, detection, and treatment of chronic complications.  Needs Instruction    Patient understands how to develop strategies to address psychosocial issues.  Needs Instruction    Patient understands how to develop strategies to promote health/change behavior.  Needs Instruction      Complications   How often do you check your blood sugar?  0 times/day (not testing)    Have you had a dilated eye exam in the past 12  months?  Yes    Have you had a dental exam in the past 12 months?  Yes    Are you checking your feet?  Yes    How many days per week are you checking your feet?  3      Dietary Intake   Breakfast  pack of crackers    Snack (morning)  yogurt    Lunch  hamburger fries and soda    Dinner  chicken or fish with baked beans or corn with grees    Snack (evening)  ice cream or cookies or candy    Beverage(s)  coffee, water, sweet tea, lemonade       Exercise   Exercise Type  Light (walking / raking leaves)    How many days per week to you exercise?  3    How many minutes per day do you exercise?  30    Total minutes per week of exercise  90      Patient Education   Previous Diabetes Education  No    Disease state   Definition of diabetes, type 1 and 2, and the diagnosis of diabetes;Factors that contribute to the development of diabetes    Nutrition management   Role of diet in the treatment of diabetes and the relationship between the three main macronutrients and blood glucose level;Food label reading, portion sizes and measuring food.;Meal options for control of blood glucose level and chronic complications.  Physical activity and exercise   Role of exercise on diabetes management, blood pressure control and cardiac health.;Identified with patient nutritional and/or medication changes necessary with exercise.    Monitoring  Taught/evaluated SMBG meter.;Purpose and frequency of SMBG.;Yearly dilated eye exam;Daily foot exams    Acute complications  Taught treatment of hypoglycemia - the 15 rule.;Discussed and identified patients' treatment of hyperglycemia.    Chronic complications  Assessed and discussed foot care and prevention of foot problems;Retinopathy and reason for yearly dilated eye exams;Dental care;Lipid levels, blood glucose control and heart disease    Psychosocial adjustment  Worked with patient to identify barriers to care and solutions;Role of stress on diabetes       Individualized Goals (developed by patient)   Nutrition  Follow meal plan discussed;Adjust meds/carbs with exercise as discussed    Physical Activity  Exercise 3-5 times per week;15 minutes per day    Medications  take my medication as prescribed    Monitoring   test my blood glucose as discussed;test blood glucose pre and post meals as discussed      Post-Education Assessment   Patient understands the diabetes disease and treatment process.  Demonstrates understanding / competency    Patient understands incorporating nutritional management into lifestyle.  Demonstrates understanding / competency    Patient undertands incorporating physical activity into lifestyle.  Demonstrates understanding / competency    Patient understands using medications safely.  Demonstrates understanding / competency    Patient understands monitoring blood glucose, interpreting and using results  Demonstrates understanding / competency    Patient understands prevention, detection, and treatment of acute complications.  Demonstrates understanding / competency    Patient understands prevention, detection, and treatment of chronic complications.  Demonstrates understanding / competency    Patient understands how to develop strategies to address psychosocial issues.  Demonstrates understanding / competency    Patient understands how to develop strategies to promote health/change behavior.  Demonstrates understanding / competency      Outcomes   Expected Outcomes  Demonstrated interest in learning. Expect positive outcomes    Future DMSE  PRN    Program Status  Completed       Individualized Plan for Diabetes Self-Management Training:   Learning Objective:  Patient will have a greater understanding of diabetes self-management. Patient education plan is to attend individual and/or group sessions per assessed needs and concerns.   Plan:   There are no Patient Instructions on file for this visit.  Expected  Outcomes:  Demonstrated interest in learning. Expect positive outcomes  Education material provided: Living Well with Diabetes, My Plate and Snack sheet  If problems or questions, patient to contact team via:  Phone  Future DSME appointment: PRN

## 2017-08-11 ENCOUNTER — Telehealth: Payer: Self-pay | Admitting: *Deleted

## 2017-08-11 MED ORDER — ACCU-CHEK SOFT TOUCH LANCETS MISC: 3 refills | Status: AC

## 2017-08-11 MED ORDER — GLUCOSE BLOOD VI STRP: ORAL_STRIP | 3 refills | Status: AC

## 2017-08-11 MED ORDER — ACCU-CHEK AVIVA PLUS W/DEVICE KIT: PACK | 0 refills | Status: AC

## 2017-08-11 NOTE — Telephone Encounter (Signed)
Reviewed chart pt is up-to-date sent BS monitor w/supplies to [facility].Melinda Prince   Copied from [place] [phone]. Topic: General - Other >> Aug 11, 2017  8:12 AM Oneta Rack wrote:  Relation to YL:TEIH Call back [place]: [id number] [place] [place] [phone] (Phone) [phone] (Fax)  Reason for call:  Requesting accu chek aviva meter and accu chek aviva plus test strips >> Aug 11, 2017  8:14 AM Oneta Rack wrote:  Relation to WT:GRMB Call back [place]: [id number] [place], [place] [phone] (Phone) [phone] (Fax)  Reason for call:  Requesting accu chek aviva meter and accu chek aviva plus test strips

## 2017-09-07 ENCOUNTER — Encounter: Payer: PRIVATE HEALTH INSURANCE | Attending: Internal Medicine | Admitting: Skilled Nursing Facility1

## 2017-09-07 ENCOUNTER — Encounter: Payer: Self-pay | Admitting: Skilled Nursing Facility1

## 2017-09-07 DIAGNOSIS — Z713 Dietary counseling and surveillance: Secondary | ICD-10-CM | POA: Insufficient documentation

## 2017-09-07 DIAGNOSIS — Z6841 Body Mass Index (BMI) 40.0 and over, adult: Secondary | ICD-10-CM | POA: Insufficient documentation

## 2017-09-07 DIAGNOSIS — E119 Type 2 diabetes mellitus without complications: Secondary | ICD-10-CM | POA: Diagnosis not present

## 2017-09-07 NOTE — Progress Notes (Signed)
Pts A1C 6.7. Pt states she has been feeling depressed for the last year resulting in stress and no sleep. Pt states on days she is feeling bad she eats candy, soda, sweet tea, desserts.    Wt: 272  Pt arrives having lost about 6 pounds. Pt states eating breakfast is still a struggle but tries to do yogurt or fruit and started eating: prunes, spinach, brussel sprouts, chicken, fish, been doing really good reading labels and carb counting threw away carb bars, drinking more water pt states she does not like rice. Pt states her legs are getting red patches on the outside of her legs and will talk to her doctor about this. Pt states she is Sleeping better and feeling better. Pt states she is checking her feet every day and checking her blood sugar 2 times a day fasting and 2 hours after a meal: all sugars were within a tight range and were even better controlled at around 109 with the metformin. Pt will check her blood sugar for another 2 weeks while she is new to the lifestyle changes and the metformin.   Breakfast: yogurt or fruit  Snack: fat free jello or pudding and nuts  Lunch: all vegetables  Dinner: chicken or fish vegetable and potato with sour cream   Snack: nuts  Walking 30 minutes after lunch tried cardio and hurt her back   Drinking water sometimes with some lemon juice   Goals: -Smoothie: milk, fruit, protein powder, spinach, protein powder  -Try the protein blends by [employer] -Be sure to have dinner every night  -Be sure to have at least 30 grams of carbohydrate with lunch  [email or web address]   For recipes  Check your blood sugars for another 2 weeks to see how your lifestyle changes and metformin are affecting your diabetes

## 2017-09-07 NOTE — Patient Instructions (Addendum)
-  Smoothie: milk, fruit, protein powder, spinach, protein powder   -Try the protein blends by [employer]  -Be sure to have dinner every night   -Be sure to have at least 30 grams of carbohydrate with lunch   [email or web address]   For recipes   Check your blood sugars for another 2 weeks to see how your lifestyle changes and metformin are affecting your diabetes

## 2017-10-19 ENCOUNTER — Ambulatory Visit: Payer: PRIVATE HEALTH INSURANCE | Admitting: Skilled Nursing Facility1

## 2017-10-21 ENCOUNTER — Encounter: Payer: Self-pay | Admitting: Internal Medicine

## 2017-10-21 ENCOUNTER — Ambulatory Visit: Payer: PRIVATE HEALTH INSURANCE | Admitting: Internal Medicine

## 2017-10-21 MED ORDER — LIRAGLUTIDE -WEIGHT MANAGEMENT 18 MG/3ML ~~LOC~~ SOPN: PEN_INJECTOR | SUBCUTANEOUS | 0 refills | Status: DC

## 2017-10-21 MED ORDER — LIRAGLUTIDE -WEIGHT MANAGEMENT 18 MG/3ML ~~LOC~~ SOPN: 3.0000 mg | PEN_INJECTOR | Freq: Every day | SUBCUTANEOUS | 11 refills | Status: DC

## 2017-10-21 NOTE — Assessment & Plan Note (Signed)
Weight is stable on belviq but she is disappointed with results and did not notice clinical benefit. She would be willing to switch to saxenda which is prescribed today.

## 2017-10-21 NOTE — Progress Notes (Signed)
° °  Subjective:    Patient ID: Melinda Prince, female    DOB: 1970/09/08, 47 y.o.   MRN: [id number]  HPI The patient is a 47 YO female coming in for weight management. She has been taking belviq for the last 3 months or so. Lost about 8 pounds which she has regained. She is doing nutrition program for diabetes/pre-diabetes. She is doing well with diet and is more active and is frustrated about the weight. She denies appetite suppression with belviq.   Review of Systems  Constitutional: Negative.   HENT: Negative.   Eyes: Negative.   Respiratory: Negative for cough, chest tightness and shortness of breath.   Cardiovascular: Negative for chest pain, palpitations and leg swelling.  Gastrointestinal: Negative for abdominal distention, abdominal pain, constipation, diarrhea, nausea and vomiting.  Musculoskeletal: Negative.   Skin: Negative.   Neurological: Negative.       Objective:   Physical Exam  Constitutional: She is oriented to person, place, and time. She appears well-developed and well-nourished.  HENT:  Head: Normocephalic and atraumatic.  Eyes: EOM are normal.  Neck: Normal range of motion.  Cardiovascular: Normal rate and regular rhythm.  Pulmonary/Chest: Effort normal and breath sounds normal. No respiratory distress. She has no wheezes. She has no rales.  Abdominal: Soft.  Musculoskeletal: She exhibits no edema.  Neurological: She is alert and oriented to person, place, and time. Coordination normal.  Skin: Skin is warm and dry.   Vitals:   10/21/17 0823  BP: (!) 134/92  Pulse: 93  Temp: 97.7 F (36.5 C)  TempSrc: Oral  SpO2: 98%  Weight: 279 lb (126.6 kg)  Height: 5' 5.5" (1.664 m)      Assessment & Plan:

## 2017-10-21 NOTE — Patient Instructions (Addendum)
We have sent in the saxenda to do which is a daily injection to help with the weight.   For saxenda do   week 1: 0.6 mg daily  Week 2: 1.2 mg daily  Week 3: 1.8 mg daily  Week 4: 2.4 mg daily  Week 5 onwards: 3 mg daily

## 2017-10-25 ENCOUNTER — Encounter: Payer: Self-pay | Admitting: Internal Medicine

## 2017-10-27 ENCOUNTER — Encounter: Payer: Self-pay | Admitting: Internal Medicine

## 2017-10-27 ENCOUNTER — Telehealth: Payer: Self-pay

## 2017-10-27 NOTE — Telephone Encounter (Signed)
PA started on CoverMyMeds KEY: [id number] PA was denied

## 2017-10-29 NOTE — Telephone Encounter (Signed)
PA Appeal started key [id number]

## 2017-11-02 ENCOUNTER — Encounter: Payer: Self-pay | Admitting: Internal Medicine

## 2017-11-03 ENCOUNTER — Encounter: Payer: Self-pay | Admitting: Internal Medicine

## 2017-11-03 MED ORDER — LORCASERIN HCL ER 20 MG PO TB24: 20.0000 mg | ORAL_TABLET | Freq: Every day | ORAL | 1 refills | Status: DC

## 2017-11-15 ENCOUNTER — Ambulatory Visit: Payer: PRIVATE HEALTH INSURANCE | Admitting: Skilled Nursing Facility1

## 2017-12-20 ENCOUNTER — Ambulatory Visit: Payer: PRIVATE HEALTH INSURANCE | Admitting: Skilled Nursing Facility1

## 2018-01-20 ENCOUNTER — Ambulatory Visit: Payer: PRIVATE HEALTH INSURANCE | Admitting: Internal Medicine

## 2018-02-01 ENCOUNTER — Encounter: Payer: Self-pay | Admitting: Internal Medicine

## 2018-02-01 ENCOUNTER — Ambulatory Visit: Payer: PRIVATE HEALTH INSURANCE | Admitting: Internal Medicine

## 2018-02-01 NOTE — Assessment & Plan Note (Signed)
She is down about 12 pounds since starting belviq which is a success. She is eating less with less cravings. She is encouraged to continue and will do so. Come back in 6 months for weight management as well.

## 2018-02-01 NOTE — Patient Instructions (Signed)
We will see you back in 6 months.

## 2018-02-01 NOTE — Progress Notes (Signed)
° °  Subjective:    Patient ID: Melinda Prince, female    DOB: Apr 07, 1971, 47 y.o.   MRN: [id number]  HPI The patient is a 47 YO female coming in for weight management. She tried saxenda after our last visit but this was too expensive and not covered by insurance. She then tried belviq and with savings card was able to get this. She has been taking it for about 3 months now. She denies side effects from this. Denies headaches or chest pains. She denies SOB. She is also working on making changes to diet. She is down about 12 pounds since last time. She now has her grandchildren living with her which is a source of joy to her. Denies any side effects.  Review of Systems  Constitutional: Negative.   HENT: Negative.   Eyes: Negative.   Respiratory: Negative for cough, chest tightness and shortness of breath.   Cardiovascular: Negative for chest pain, palpitations and leg swelling.  Gastrointestinal: Negative for abdominal distention, abdominal pain, constipation, diarrhea, nausea and vomiting.  Musculoskeletal: Negative.   Skin: Negative.   Neurological: Negative.   Psychiatric/Behavioral: Negative.       Objective:   Physical Exam  Constitutional: She is oriented to person, place, and time. She appears well-developed and well-nourished.  HENT:  Head: Normocephalic and atraumatic.  Eyes: EOM are normal.  Neck: Normal range of motion.  Cardiovascular: Normal rate and regular rhythm.  Pulmonary/Chest: Effort normal and breath sounds normal. No respiratory distress. She has no wheezes. She has no rales.  Abdominal: Soft.  Musculoskeletal: She exhibits no edema.  Neurological: She is alert and oriented to person, place, and time. Coordination normal.  Skin: Skin is warm and dry.   Vitals:   02/01/18 0821  BP: 118/80  Pulse: 92  Temp: (!) 97.4 F (36.3 C)  TempSrc: Oral  SpO2: 96%  Weight: 267 lb (121.1 kg)  Height: 5' 5.5" (1.664 m)      Assessment & Plan:

## 2018-03-10 ENCOUNTER — Other Ambulatory Visit: Payer: Self-pay | Admitting: Internal Medicine

## 2018-03-10 MED ORDER — BENZONATATE 200 MG PO CAPS: 200.0000 mg | ORAL_CAPSULE | Freq: Three times a day (TID) | ORAL | 0 refills | Status: DC | PRN

## 2018-03-10 MED ORDER — ALBUTEROL SULFATE HFA 108 (90 BASE) MCG/ACT IN AERS
2.0000 | INHALATION_SPRAY | Freq: Four times a day (QID) | RESPIRATORY_TRACT | 0 refills | Status: DC | PRN
Start: 2018-03-10 — End: 2018-03-28

## 2018-03-10 NOTE — Telephone Encounter (Signed)
Contacted pt regarding medication refill; she states that she uses these medications seasonally; she can be contacted at her work phone, and she has lunch 12-1; her pharmacy of choice is [facility] [address]; will route to office for provider review.

## 2018-03-10 NOTE — Telephone Encounter (Signed)
Attempted to contact pt because requested medication was discontinued per Dr Pricilla Holm on 02/01/18; no answer at [phone].

## 2018-03-10 NOTE — Telephone Encounter (Signed)
Patient returned call from [facility] and stated that she is confused by the message.  Please call patient back at the following number to clarify - [phone], ext. [phone]

## 2018-03-10 NOTE — Telephone Encounter (Signed)
Pt is having chest congestion, cough, slight wheeze as well. Pt requesting tessalon pearls to help. She's been coughing for 8 days.

## 2018-03-10 NOTE — Telephone Encounter (Signed)
Sent in what I think is being requested. I sent in albuterol inhaler and tessalon perles.

## 2018-03-10 NOTE — Telephone Encounter (Signed)
LVM for patient to call back and let us know what symptoms she is having

## 2018-03-10 NOTE — Telephone Encounter (Signed)
Copied from [id number]. Topic: General - Other >> Mar 10, 2018  8:34 AM Janace Aris A wrote: Medication: albuterol (PROVENTIL) & Coughing Pearl Tablets (Patient did not know the name of medication)  Has the patient contacted their pharmacy? YES  (Agent: If yes, when and what did the pharmacy advise? No RX order received yet.  Preferred Pharmacy (with phone number or street name): [id number] [place], [place]  [phone] (Phone) [phone] (Fax)

## 2018-03-10 NOTE — Addendum Note (Signed)
Addended by: Pricilla Holm A on: 03/10/2018 02:18 PM   Modules accepted: Orders

## 2018-03-28 ENCOUNTER — Other Ambulatory Visit: Payer: Self-pay | Admitting: Internal Medicine

## 2018-05-30 ENCOUNTER — Encounter: Payer: Self-pay | Admitting: Internal Medicine

## 2018-06-01 ENCOUNTER — Encounter: Payer: Self-pay | Admitting: Internal Medicine

## 2018-06-01 NOTE — Progress Notes (Signed)
Abstracted and sent to scan

## 2018-07-12 ENCOUNTER — Other Ambulatory Visit: Payer: Self-pay | Admitting: Internal Medicine

## 2018-07-12 NOTE — Telephone Encounter (Signed)
Called medication in

## 2018-08-04 ENCOUNTER — Other Ambulatory Visit (INDEPENDENT_AMBULATORY_CARE_PROVIDER_SITE_OTHER): Payer: PRIVATE HEALTH INSURANCE

## 2018-08-04 ENCOUNTER — Ambulatory Visit (INDEPENDENT_AMBULATORY_CARE_PROVIDER_SITE_OTHER): Payer: PRIVATE HEALTH INSURANCE | Admitting: Internal Medicine

## 2018-08-04 ENCOUNTER — Encounter: Payer: Self-pay | Admitting: Internal Medicine

## 2018-08-04 VITALS — BP 120/78 | HR 87 | Temp 97.4°F | Ht 65.5 in | Wt 256.0 lb

## 2018-08-04 DIAGNOSIS — Z0000 Encounter for general adult medical examination without abnormal findings: Secondary | ICD-10-CM

## 2018-08-04 DIAGNOSIS — R7303 Prediabetes: Secondary | ICD-10-CM

## 2018-08-04 LAB — CBC
HCT: 40.7 % (ref 36.0–46.0)
Hemoglobin: 13.2 g/dL (ref 12.0–15.0)
MCHC: 32.5 g/dL (ref 30.0–36.0)
MCV: 79.8 fl (ref 78.0–100.0)
Platelets: 336 10*3/uL (ref 150.0–400.0)
RBC: 5.1 Mil/uL (ref 3.87–5.11)
RDW: 16 % — ABNORMAL HIGH (ref 11.5–15.5)
WBC: 7.8 10*3/uL (ref 4.0–10.5)

## 2018-08-04 LAB — COMPREHENSIVE METABOLIC PANEL
ALBUMIN: 4.3 g/dL (ref 3.5–5.2)
ALT: 10 U/L (ref 0–35)
AST: 10 U/L (ref 0–37)
Alkaline Phosphatase: 51 U/L (ref 39–117)
BILIRUBIN TOTAL: 0.4 mg/dL (ref 0.2–1.2)
BUN: 12 mg/dL (ref 6–23)
CO2: 28 mEq/L (ref 19–32)
Calcium: 9.4 mg/dL (ref 8.4–10.5)
Chloride: 104 mEq/L (ref 96–112)
Creatinine, Ser: 0.88 mg/dL (ref 0.40–1.20)
GFR: 82.36 mL/min (ref >60.00)
Glucose, Bld: 90 mg/dL (ref 70–99)
Potassium: 4.2 mEq/L (ref 3.5–5.1)
Sodium: 140 mEq/L (ref 135–145)
Total Protein: 7.4 g/dL (ref 6.0–8.3)

## 2018-08-04 LAB — HEMOGLOBIN A1C: HEMOGLOBIN A1C: 6 % (ref 4.6–6.5)

## 2018-08-04 LAB — LIPID PANEL
Cholesterol: 194 mg/dL (ref 0–200)
HDL: 44.1 mg/dL (ref >39.00)
LDL Cholesterol: 126 mg/dL — ABNORMAL HIGH (ref 0–99)
NonHDL: 150.01
Total CHOL/HDL Ratio: 4
Triglycerides: 120 mg/dL (ref 0.0–149.0)
VLDL: 24 mg/dL (ref 0.0–40.0)

## 2018-08-04 NOTE — Progress Notes (Signed)
° °  Subjective:   Patient ID: Melinda Prince, female    DOB: 05-24-1971, 48 y.o.   MRN: [id number]  HPI The patient is a 48 YO female coming in for physical.   PMH, [place], social history reviewed and updated  Review of Systems  Constitutional: Negative.   HENT: Negative.   Eyes: Negative.   Respiratory: Negative for cough, chest tightness and shortness of breath.   Cardiovascular: Negative for chest pain, palpitations and leg swelling.  Gastrointestinal: Negative for abdominal distention, abdominal pain, constipation, diarrhea, nausea and vomiting.  Musculoskeletal: Negative.   Skin: Negative.   Neurological: Negative.   Psychiatric/Behavioral: Negative.     Objective:  Physical Exam Constitutional:      Appearance: She is well-developed. She is obese.  HENT:     Head: Normocephalic and atraumatic.  Neck:     Musculoskeletal: Normal range of motion.  Cardiovascular:     Rate and Rhythm: Normal rate and regular rhythm.  Pulmonary:     Effort: Pulmonary effort is normal. No respiratory distress.     Breath sounds: Normal breath sounds. No wheezing or rales.  Abdominal:     General: Bowel sounds are normal. There is no distension.     Palpations: Abdomen is soft.     Tenderness: There is no abdominal tenderness. There is no rebound.  Skin:    General: Skin is warm and dry.  Neurological:     Mental Status: She is alert and oriented to person, place, and time.     Coordination: Coordination normal.     Vitals:   08/04/18 0802  BP: 120/78  Pulse: 87  Temp: (!) 97.4 F (36.3 C)  TempSrc: Oral  SpO2: 98%  Weight: 256 lb (116.1 kg)  Height: 5' 5.5" (1.664 m)    Assessment & Plan:

## 2018-08-04 NOTE — Assessment & Plan Note (Signed)
Flu shot up to date. Tetanus up to date. Mammogram up to date, pap smear up to date. Counseled about sun safety and mole surveillance. Counseled about the dangers of distracted driving. Given 10 year screening recommendations.

## 2018-08-04 NOTE — Patient Instructions (Signed)
Health Maintenance, Female °Adopting a healthy lifestyle and getting preventive care can go a long way to promote health and wellness. Talk with your health care provider about what schedule of regular examinations is right for you. This is a good chance for you to check in with your provider about disease prevention and staying healthy. °In between checkups, there are plenty of things you can do on your own. Experts have done a lot of research about which lifestyle changes and preventive measures are most likely to keep you healthy. Ask your health care provider for more information. °Weight and diet °Eat a healthy diet °· Be sure to include plenty of vegetables, fruits, low-fat dairy products, and lean protein. °· Do not eat a lot of foods high in solid fats, added sugars, or salt. °· Get regular exercise. This is one of the most important things you can do for your health. °? Most adults should exercise for at least 150 minutes each week. The exercise should increase your heart rate and make you sweat (moderate-intensity exercise). °? Most adults should also do strengthening exercises at least twice a week. This is in addition to the moderate-intensity exercise. °Maintain a healthy weight °· Body mass index (BMI) is a measurement that can be used to identify possible weight problems. It estimates body fat based on height and weight. Your health care provider can help determine your BMI and help you achieve or maintain a healthy weight. °· For females 20 years of age and older: °? A BMI below 18.5 is considered underweight. °? A BMI of 18.5 to 24.9 is normal. °? A BMI of 25 to 29.9 is considered overweight. °? A BMI of 30 and above is considered obese. °Watch levels of cholesterol and blood lipids °· You should start having your blood tested for lipids and cholesterol at 50 years of age, then have this test every 5 years. °· You may need to have your cholesterol levels checked more often if: °? Your lipid or  cholesterol levels are high. °? You are older than 50 years of age. °? You are at high risk for heart disease. °Cancer screening °Lung Cancer °· Lung cancer screening is recommended for adults 55-80 years old who are at high risk for lung cancer because of a history of smoking. °· A yearly low-dose CT scan of the lungs is recommended for people who: °? Currently smoke. °? Have quit within the past 15 years. °? Have at least a 30-pack-year history of smoking. A pack year is smoking an average of one pack of cigarettes a day for 1 year. °· Yearly screening should continue until it has been 15 years since you quit. °· Yearly screening should stop if you develop a health problem that would prevent you from having lung cancer treatment. °Breast Cancer °· Practice breast self-awareness. This means understanding how your breasts normally appear and feel. °· It also means doing regular breast self-exams. Let your health care provider know about any changes, no matter how small. °· If you are in your 20s or 30s, you should have a clinical breast exam (CBE) by a health care provider every 1-3 years as part of a regular health exam. °· If you are 40 or older, have a CBE every year. Also consider having a breast X-ray (mammogram) every year. °· If you have a family history of breast cancer, talk to your health care provider about genetic screening. °· If you are at high risk for breast cancer, talk   to your health care provider about having an MRI and a mammogram every year.  Breast cancer gene (BRCA) assessment is recommended for women who have family members with BRCA-related cancers. BRCA-related cancers include: ? Breast. ? Ovarian. ? Tubal. ? Peritoneal cancers.  Results of the assessment will determine the need for genetic counseling and BRCA1 and BRCA2 testing. Cervical Cancer Your health care provider may recommend that you be screened regularly for cancer of the pelvic organs (ovaries, uterus, and vagina).  This screening involves a pelvic examination, including checking for microscopic changes to the surface of your cervix (Pap test). You may be encouraged to have this screening done every 3 years, beginning at age 31.  For women ages 19-65, health care providers may recommend pelvic exams and Pap testing every 3 years, or they may recommend the Pap and pelvic exam, combined with testing for human papilloma virus (HPV), every 5 years. Some types of HPV increase your risk of cervical cancer. Testing for HPV may also be done on women of any age with unclear Pap test results.  Other health care providers may not recommend any screening for nonpregnant women who are considered low risk for pelvic cancer and who do not have symptoms. Ask your health care provider if a screening pelvic exam is right for you.  If you have had past treatment for cervical cancer or a condition that could lead to cancer, you need Pap tests and screening for cancer for at least 20 years after your treatment. If Pap tests have been discontinued, your risk factors (such as having a new sexual partner) need to be reassessed to determine if screening should resume. Some women have medical problems that increase the chance of getting cervical cancer. In these cases, your health care provider may recommend more frequent screening and Pap tests. Colorectal Cancer  This type of cancer can be detected and often prevented.  Routine colorectal cancer screening usually begins at 50 years of age and continues through 50 years of age.  Your health care provider may recommend screening at an earlier age if you have risk factors for colon cancer.  Your health care provider may also recommend using home test kits to check for hidden blood in the stool.  A small camera at the end of a tube can be used to examine your colon directly (sigmoidoscopy or colonoscopy). This is done to check for the earliest forms of colorectal cancer.  Routine  screening usually begins at age 17.  Direct examination of the colon should be repeated every 5-10 years through 51 years of age. However, you may need to be screened more often if early forms of precancerous polyps or small growths are found. Skin Cancer  Check your skin from head to toe regularly.  Tell your health care provider about any new moles or changes in moles, especially if there is a change in a mole's shape or color.  Also tell your health care provider if you have a mole that is larger than the size of a pencil eraser.  Always use sunscreen. Apply sunscreen liberally and repeatedly throughout the day.  Protect yourself by wearing long sleeves, pants, a wide-brimmed hat, and sunglasses whenever you are outside. Heart disease, diabetes, and high blood pressure  High blood pressure causes heart disease and increases the risk of stroke. High blood pressure is more likely to develop in: ? People who have blood pressure in the high end of the normal range (130-139/85-89 mm Hg). ? People  who are overweight or obese. ? People who are African American.  If you are 78-43 years of age, have your blood pressure checked every 3-5 years. If you are 83 years of age or older, have your blood pressure checked every year. You should have your blood pressure measured twice--once when you are at a hospital or clinic, and once when you are not at a hospital or clinic. Record the average of the two measurements. To check your blood pressure when you are not at a hospital or clinic, you can use: ? An automated blood pressure machine at a pharmacy. ? A home blood pressure monitor.  If you are between 65 years and 60 years old, ask your health care provider if you should take aspirin to prevent strokes.  Have regular diabetes screenings. This involves taking a blood sample to check your fasting blood sugar level. ? If you are at a normal weight and have a low risk for diabetes, have this test once  every three years after 50 years of age. ? If you are overweight and have a high risk for diabetes, consider being tested at a younger age or more often. Preventing infection Hepatitis B  If you have a higher risk for hepatitis B, you should be screened for this virus. You are considered at high risk for hepatitis B if: ? You were born in a country where hepatitis B is common. Ask your health care provider which countries are considered high risk. ? Your parents were born in a high-risk country, and you have not been immunized against hepatitis B (hepatitis B vaccine). ? You have HIV or AIDS. ? You use needles to inject street drugs. ? You live with someone who has hepatitis B. ? You have had sex with someone who has hepatitis B. ? You get hemodialysis treatment. ? You take certain medicines for conditions, including cancer, organ transplantation, and autoimmune conditions. Hepatitis C  Blood testing is recommended for: ? Everyone born from [birth year] through 1967. ? Anyone with known risk factors for hepatitis C. Sexually transmitted infections (STIs)  You should be screened for sexually transmitted infections (STIs) including gonorrhea and chlamydia if: ? You are sexually active and are younger than 50 years of age. ? You are older than 50 years of age and your health care provider tells you that you are at risk for this type of infection. ? Your sexual activity has changed since you were last screened and you are at an increased risk for chlamydia or gonorrhea. Ask your health care provider if you are at risk.  If you do not have HIV, but are at risk, it may be recommended that you take a prescription medicine daily to prevent HIV infection. This is called pre-exposure prophylaxis (PrEP). You are considered at risk if: ? You are sexually active and do not regularly use condoms or know the HIV status of your partner(s). ? You take drugs by injection. ? You are sexually active with a partner  who has HIV. Talk with your health care provider about whether you are at high risk of being infected with HIV. If you choose to begin PrEP, you should first be tested for HIV. You should then be tested every 3 months for as long as you are taking PrEP. Pregnancy  If you are premenopausal and you may become pregnant, ask your health care provider about preconception counseling.  If you may become pregnant, take 400 to 800 micrograms (mcg) of folic acid every  day. °· If you want to prevent pregnancy, talk to your health care provider about birth control (contraception). °Osteoporosis and menopause °· Osteoporosis is a disease in which the bones lose minerals and strength with aging. This can result in serious bone fractures. Your risk for osteoporosis can be identified using a bone density scan. °· If you are 65 years of age or older, or if you are at risk for osteoporosis and fractures, ask your health care provider if you should be screened. °· Ask your health care provider whether you should take a calcium or vitamin D supplement to lower your risk for osteoporosis. °· Menopause may have certain physical symptoms and risks. °· Hormone replacement therapy may reduce some of these symptoms and risks. °Talk to your health care provider about whether hormone replacement therapy is right for you. °Follow these instructions at home: °· Schedule regular health, dental, and eye exams. °· Stay current with your immunizations. °· Do not use any tobacco products including cigarettes, chewing tobacco, or electronic cigarettes. °· If you are pregnant, do not drink alcohol. °· If you are breastfeeding, limit how much and how often you drink alcohol. °· Limit alcohol intake to no more than 1 drink per day for nonpregnant women. One drink equals 12 ounces of beer, 5 ounces of wine, or 1½ ounces of hard liquor. °· Do not use street drugs. °· Do not share needles. °· Ask your health care provider for help if you need support  or information about quitting drugs. °· Tell your health care provider if you often feel depressed. °· Tell your health care provider if you have ever been abused or do not feel safe at home. °This information is not intended to replace advice given to you by your health care provider. Make sure you discuss any questions you have with your health care provider. °Document Released: 12/29/2010 Document Revised: 11/21/2015 Document Reviewed: 03/19/2015 °Elsevier Interactive Patient Education © 2019 [employer] ° °

## 2018-08-04 NOTE — Assessment & Plan Note (Signed)
Taking belviq and weight is still decreasing with diet and exercise. She will continue.

## 2018-08-04 NOTE — Assessment & Plan Note (Signed)
Most recent HgA1c with new diabetes. Will check today and if lower will keep diagnosis of pre-diabetes as this was never confirmed and she has lost about 20-30 pounds since that reading.

## 2018-08-11 ENCOUNTER — Telehealth: Payer: Self-pay

## 2018-08-11 NOTE — Telephone Encounter (Signed)
Copied from [place] [id number]. Topic: General - Other >> Aug 11, 2018 11:43 AM Windy Kalata wrote: Reason for CRM: Patient calling for lab results  Best call back is [phone]

## 2018-08-11 NOTE — Telephone Encounter (Signed)
Patient informed of labs and stated understanding

## 2018-08-17 ENCOUNTER — Other Ambulatory Visit: Payer: Self-pay

## 2018-08-17 ENCOUNTER — Telehealth: Payer: Self-pay | Admitting: Internal Medicine

## 2018-08-17 MED ORDER — CETIRIZINE HCL 10 MG PO TABS: 10.0000 mg | ORAL_TABLET | Freq: Every day | ORAL | 3 refills | Status: DC

## 2018-08-17 NOTE — Telephone Encounter (Signed)
Will route to office for final disposition; pt last seen in office 08/04/2018 by Dr Pricilla Holm, [facility]; also see CRM [phone].   Medication:  BELVIQ XR 20 MG TB24  Pt states that this medication is unavailable, and pt needs to know what medication to switch to. Please call pt to advise.  [phone]

## 2018-08-17 NOTE — Telephone Encounter (Signed)
Copied from [place] [id number]. Topic: Quick Communication - Rx Refill/Question >> Aug 17, 2018 10:49 AM Burchel, Abbi R wrote: Medication:  BELVIQ XR 20 MG TB24   Pt states that this medication is unavailable, and pt needs to know what medication to switch to. Please call pt to advise.    [phone]

## 2018-08-17 NOTE — Telephone Encounter (Signed)
Called patient back and states that the medication is being taken off the market as it has shown to cause cancer and they will no longer be having it in stock. Patient wanting to know if an alternative can be sent in

## 2018-08-18 NOTE — Telephone Encounter (Signed)
Can switch to contrave or qsymia whichever she wants to try.

## 2018-08-18 NOTE — Telephone Encounter (Signed)
LVM with MD response

## 2018-08-31 NOTE — Telephone Encounter (Signed)
Pt stated that she would like the contrave to be called in.

## 2018-09-01 MED ORDER — NALTREXONE-BUPROPION HCL ER 8-90 MG PO TB12: ORAL_TABLET | ORAL | 0 refills | Status: DC

## 2018-09-01 NOTE — Addendum Note (Signed)
Addended by: Pricilla Holm A on: 09/01/2018 11:30 AM   Modules accepted: Orders

## 2018-09-01 NOTE — Telephone Encounter (Signed)
Patient informed of MD response

## 2018-09-01 NOTE — Telephone Encounter (Signed)
Sent in, week 1 take 1 pill daily, week 2 take 1 pill twice daily, week 3 take 2 pills morning 1 pill evening, week 4 and onwards take 2 pills twice daily. Rx sent for intro month call for refill so we can adjust dosing then.

## 2018-09-29 ENCOUNTER — Ambulatory Visit: Payer: Self-pay

## 2018-09-29 ENCOUNTER — Ambulatory Visit (INDEPENDENT_AMBULATORY_CARE_PROVIDER_SITE_OTHER): Payer: PRIVATE HEALTH INSURANCE | Admitting: Internal Medicine

## 2018-09-29 ENCOUNTER — Encounter: Payer: Self-pay | Admitting: Internal Medicine

## 2018-09-29 NOTE — Assessment & Plan Note (Signed)
Suspect that rash is reaction to contrave. Will drop dose back to 1 pill twice a day which was tolerated. If rash still persistent will stop contrave for 2 weeks and then resume at starting dose.

## 2018-09-29 NOTE — Progress Notes (Signed)
Virtual Visit via Video Note  I connected with Melinda Prince on 09/29/18 at 10:20 AM EDT by a video enabled telemedicine application and verified that I am speaking with the correct person using two identifiers.   I discussed the limitations of evaluation and management by telemedicine and the availability of in person appointments. The patient expressed understanding and agreed to proceed.  History of Present Illness: The patient is a 48 y.o. YO female with visit for rash. Started today. Recently started contrave for weight loss. Has been doing fine on 1 pill twice a day. Got the rash when she increased to 2 pills in the morning and 1 pill in the evening which she did several days ago. Denies itching but red rash on the stomach and breasts and side. Overall it is stable since onset. Has tried nothing for it. Denies other changes including soap, shampoo, detergent. Denies other new medications or supplements.   Observations/Objective: Appearance: normal, breathing appears normal, no facial swelling, work Nurse, mental health grooming, abdomen does not appear distended, throat normal and no edema, mental status is A and O times 3  Assessment and Plan: See problem oriented charting  Follow Up Instructions: decrease contrave to 1 pill twice a day and give rash 1 week to clear, if no clearance stop contrave for 2 weeks and then resume at starting dose  I discussed the assessment and treatment plan with the patient. The patient was provided an opportunity to ask questions and all were answered. The patient agreed with the plan and demonstrated an understanding of the instructions.   The patient was advised to call back or seek an in-person evaluation if the symptoms worsen or if the condition fails to improve as anticipated.  Hoyt Koch, MD

## 2018-09-29 NOTE — Telephone Encounter (Signed)
Appointment has been made to a virtual visit.

## 2018-09-29 NOTE — Telephone Encounter (Signed)
Phone cal to pt.  Reported she noticed rash today on breasts, stomach, and bilateral flank region.  Reported the rash looks like red spots, about the size of a quarter.  Denied any rash on face; denied swelling of lips, tongue, or throat.  Denied fever, pain, or itching.  Reported she feels the rash is a side effect of Naltrexone Bupropion.  Stated she increased her dose on Sunday to 3 pills/ day.  appt scheduled with PCP at 10:20 AM today.  Advised pt this will be a virtual visit.  Email verified.  Pt. prefers to be called on her work number for phone contact.  Pt. Agrees with plan,      Reason for Disposition  Taking new prescription medicine  (Exceptions: finished taking new prescription antibiotic OR questions about flushing from niacin)  Answer Assessment - Initial Assessment Questions 1. APPEARANCE of RASH: "Describe the rash." (e.g., spots, blisters, raised areas, skin peeling, scaly)     Red spots on breasts, stomach and bilateral flank 2. SIZE: "How big are the spots?" (e.g., tip of pen, eraser, coin; inches, centimeters)     Quarter size 3. LOCATION: "Where is the rash located?"     See #1 4. COLOR: "What color is the rash?" (Note: It is difficult to assess rash color in people with darker-colored skin. When this situation occurs, simply ask the caller to describe what they see.)     Red spots  5. ONSET: "When did the rash begin?"    Sunday, 3/29 6. FEVER: "Do you have a fever?" If so, ask: "What is your temperature, how was it measured, and when did it start?"     denied 7. ITCHING: "Does the rash itch?" If so, ask: "How bad is the itch?" (Scale 1-10; or mild, moderate, severe)     Denied 8. CAUSE: "What do you think is causing the rash?"     New medication  9. NEW MEDICATION: "What new medication are you taking?" (e.g., name of antibiotic) "When did you start taking this medication?".    Naltrexone- Bupropion for weight loss  10. OTHER SYMPTOMS: "Do you have any other symptoms?"  (e.g., sore throat, fever, joint pain)      Denied  11. PREGNANCY: "Is there any chance you are pregnant?" "When was your last menstrual period?"       No period x 6 years  Protocols used: RASH - WIDESPREAD ON DRUGS-A-AH

## 2018-10-05 ENCOUNTER — Ambulatory Visit: Payer: Self-pay | Admitting: *Deleted

## 2018-10-05 ENCOUNTER — Ambulatory Visit (INDEPENDENT_AMBULATORY_CARE_PROVIDER_SITE_OTHER): Payer: PRIVATE HEALTH INSURANCE | Admitting: Internal Medicine

## 2018-10-05 ENCOUNTER — Other Ambulatory Visit: Payer: Self-pay | Admitting: Internal Medicine

## 2018-10-05 ENCOUNTER — Encounter: Payer: Self-pay | Admitting: Internal Medicine

## 2018-10-05 DIAGNOSIS — R6889 Other general symptoms and signs: Secondary | ICD-10-CM | POA: Diagnosis not present

## 2018-10-05 DIAGNOSIS — Z20822 Contact with and (suspected) exposure to covid-19: Secondary | ICD-10-CM | POA: Insufficient documentation

## 2018-10-05 MED ORDER — BENZONATATE 200 MG PO CAPS: 200.0000 mg | ORAL_CAPSULE | Freq: Three times a day (TID) | ORAL | 0 refills | Status: DC | PRN

## 2018-10-05 NOTE — Telephone Encounter (Signed)
Noted

## 2018-10-05 NOTE — Progress Notes (Signed)
Virtual Visit via Video Note  I connected with Melinda Prince on 10/05/18 at  9:00 AM EDT by a video enabled telemedicine application and verified that I am speaking with the correct person using two identifiers.   I discussed the limitations of evaluation and management by telemedicine and the availability of in person appointments. The patient expressed understanding and agreed to proceed.  History of Present Illness: The patient is a 48 y.o. female with visit for cough. Started about 3-4 days ago. Is taking nasonex and zyrtec without much benefit. The cough is not productive. Denies fevers but some chills. Not having any body aches. Has some fatigue as well. Denies SOB or chest tightness. Is getting some chest pain with coughing but not outside coughing. Overall it is worsening slightly. Has tried zyrtec and nasonex  Observations/Objective: Appearance: normal, breathing appears normal, some coughing during the visit which does not impact breathing, normal grooming, abdomen does not appear distended, throat not examined, mental status is A and O times 3  Assessment and Plan: See problem oriented charting  Follow Up Instructions: rx for tessalon perles and work note faxed in for her, advised to stay out of work until feeling well and then wait 3 more days  [phone] attn: Ferol Luz fax to work  I discussed the assessment and treatment plan with the patient. The patient was provided an opportunity to ask questions and all were answered. The patient agreed with the plan and demonstrated an understanding of the instructions.   The patient was advised to call back or seek an in-person evaluation if the symptoms worsen or if the condition fails to improve as anticipated.  Hoyt Koch, MD

## 2018-10-05 NOTE — Telephone Encounter (Signed)
Patient complaining of cough and chest pain for 3 days.  Reason for Disposition  [1] Continuous (nonstop) coughing interferes with work or school AND [2] no improvement using cough treatment per protocol    Dry cough making chest sore- patient is using over the counter medication. Requesting appointment.  Answer Assessment - Initial Assessment Questions 1. ONSET: "When did the cough begin?"      3 days ago 2. SEVERITY: "How bad is the cough today?"      Cough is causing her chest sore 3. RESPIRATORY DISTRESS: "Describe your breathing."      Not effecting breathing 4. FEVER: "Do you have a fever?" If so, ask: "What is your temperature, how was it measured, and when did it start?"     No fever 5. HEMOPTYSIS: "Are you coughing up any blood?" If so ask: "How much?" (flecks, streaks, tablespoons, etc.)     No sputum 6. TREATMENT: "What have you done so far to treat the cough?" (e.g., meds, fluids, humidifier)     Started nasonex, zyrtec 7. CARDIAC HISTORY: "Do you have any history of heart disease?" (e.g., heart attack, congestive heart failure)      no 8. LUNG HISTORY: "Do you have any history of lung disease?"  (e.g., pulmonary embolus, asthma, emphysema)     no 9. PE RISK FACTORS: "Do you have a history of blood clots?" (or: recent major surgery, recent prolonged travel, bedridden)     no 10. OTHER SYMPTOMS: "Do you have any other symptoms? (e.g., runny nose, wheezing, chest pain)       Runny nose when first started 11. PREGNANCY: "Is there any chance you are pregnant?" "When was your last menstrual period?"       n/a 12. TRAVEL: "Have you traveled out of the country in the last month?" (e.g., travel history, exposures)       No travel, no known exposure  Protocols used: COUGH - ACUTE NON-PRODUCTIVE-A-AH

## 2018-10-05 NOTE — Assessment & Plan Note (Signed)
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 She is informed to self isolate. Monitor for SOB or breathing problems and contact us or seek care for SOB. Work note given/faxed to stay out of work at least 1 week from now and may need to be updated as recommendation currently is once symptom free to wait another 3 days and then safe to return to work.

## 2018-10-05 NOTE — Telephone Encounter (Signed)
Virtual made for this AM

## 2018-10-12 ENCOUNTER — Telehealth: Payer: Self-pay

## 2018-10-12 NOTE — Telephone Encounter (Signed)
Are you okay with extending the note to have patient stay out of work till Monday?

## 2018-10-12 NOTE — Telephone Encounter (Signed)
Copied from [place] [id number]. Topic: General - Other >> Oct 12, 2018 11:24 AM Burchel, Abbi R wrote: Reason for CRM:   Pt states she is still experiencing cough but fever and chills are gone.  Pt requesting work note be extended until Monday.  Please advise.    Pt: Melinda Prince: [phone]

## 2018-10-13 NOTE — Telephone Encounter (Signed)
Patient states she thinks [place] misunderstood her. She needs a clearance note stating she could return to work today. She said she is at work now and needs it faxed to [phone] attn: Ferol Luz.

## 2018-10-13 NOTE — Telephone Encounter (Signed)
Letter faxed and patient informed

## 2018-10-13 NOTE — Telephone Encounter (Signed)
Patient informed of MD response and stated understanding that she should not be back to work till the 20th since it has not been three days without chills and fevers

## 2018-10-13 NOTE — Telephone Encounter (Signed)
Depends on when fevers and chills were gone. She needs to wait 3 more days after fevers and chills are gone before it is safe to return to work. So if not 3 days then she should not be at work today.

## 2018-10-13 NOTE — Telephone Encounter (Signed)
Are you okay with this? The first note made me feel she wanted it extended not go back to work today.

## 2018-10-13 NOTE — Telephone Encounter (Signed)
Fine

## 2018-12-14 ENCOUNTER — Other Ambulatory Visit: Payer: Self-pay | Admitting: Internal Medicine

## 2019-01-13 ENCOUNTER — Telehealth: Payer: Self-pay | Admitting: Internal Medicine

## 2019-01-13 NOTE — Telephone Encounter (Signed)
Patient would like to schedule an in office appt with Dr. Sharlet Salina when she gets back into the office for anxiety.  Please advise.

## 2019-01-16 NOTE — Telephone Encounter (Signed)
Pt scheduled  

## 2019-01-16 NOTE — Telephone Encounter (Signed)
Can you schedule this?

## 2019-01-24 ENCOUNTER — Ambulatory Visit: Payer: PRIVATE HEALTH INSURANCE | Admitting: Internal Medicine

## 2019-03-10 ENCOUNTER — Telehealth: Payer: Self-pay | Admitting: Internal Medicine

## 2019-03-10 NOTE — Telephone Encounter (Signed)
Copied from [place] [id number]. Topic: General - Other >> Mar 10, 2019  8:28 AM Carolyn Stare wrote:  Pt called and ask if the below medication can be sent to [facility] 8-90 MG TB12

## 2019-03-13 NOTE — Telephone Encounter (Signed)
It would appear we sent in a 1 month supply back in June. Is she taking, did she ever take?

## 2019-03-13 NOTE — Telephone Encounter (Signed)
Pt stated she took medication back in July but did not get it refilled due to being $175 per month. KNIPPERX is only $99 per month please advise. [place]

## 2019-03-14 ENCOUNTER — Other Ambulatory Visit: Payer: Self-pay | Admitting: Internal Medicine

## 2019-03-14 ENCOUNTER — Telehealth: Payer: Self-pay

## 2019-03-14 MED ORDER — CONTRAVE 8-90 MG PO TB12: ORAL_TABLET | ORAL | 0 refills | Status: DC

## 2019-03-14 NOTE — Telephone Encounter (Signed)
Sent in, she will need to start over with titration and call back for refill as next month will have different number of pills per month.

## 2019-03-14 NOTE — Telephone Encounter (Signed)
Patient informed of MD response and stated understanding

## 2019-03-14 NOTE — Telephone Encounter (Signed)
Copied from [place] [id number]. Topic: General - Other >> Mar 14, 2019  3:01 PM Leward Quan A wrote: Reason for CRM: Patient request a call back from the nurse regarding Rx to be sent to the new pharmacy so that they can be delivered to her home. [place], [place] - [place] [phone] (Phone) [phone] (Fax)  Patient can be reached at Ph# [phone]

## 2019-03-14 NOTE — Telephone Encounter (Signed)
LVM for patient to call back and let us know what she needs

## 2019-03-15 NOTE — Telephone Encounter (Addendum)
I called pt- Rx was sent today. She was requesting this to be a recurring monthly prescription. I informed her that our office policy is for the patient to request refills from preferred pharmacy and the pharmacy contacts [place] for refill authorization. Patient verbalized understanding.

## 2019-03-15 NOTE — Telephone Encounter (Signed)
Pt returning call states Naltrexone-buPROPion HCl ER (CONTRAVE) 8-90 MG TB12  Is to be sent to the new pharmacy(mail order) every month.

## 2019-05-01 ENCOUNTER — Other Ambulatory Visit: Payer: Self-pay | Admitting: Internal Medicine

## 2019-05-01 MED ORDER — CONTRAVE 8-90 MG PO TB12: 1.0000 | ORAL_TABLET | Freq: Two times a day (BID) | ORAL | 0 refills | Status: DC

## 2019-05-01 NOTE — Telephone Encounter (Signed)
Can you ask her when she started? Is she still taking? This was filled 2 months ago and if off needs to re-tirtrate from beginning but if still taking we can refill for full dosing.

## 2019-05-01 NOTE — Telephone Encounter (Signed)
Copied from [place] [id number]. Topic: Quick Communication - Rx Refill/Question >> May 01, 2019  9:28 AM Rainey Pines A wrote: Medication: Naltrexone-buPROPion HCl ER (CONTRAVE) 8-90 MG TB12   Has the patient contacted their pharmacy? {Yes (Agent: If no, request that the patient contact the pharmacy for the refill.) (Agent: If yes, when and what did the pharmacy advise?)Contact PCP  Preferred Pharmacy (with phone number or street name): [facility], [place] [phone] (Phone) [phone] (Fax)    Agent: Please be advised that RX refills may take up to 3 business days. We ask that you follow-up with your pharmacy.

## 2019-05-01 NOTE — Telephone Encounter (Signed)
Requested medication (s) are due for refill today: yes  Requested medication (s) are on the active medication list: yes  Last refill:  03/10/2019  Future visit scheduled: no  Notes to clinic: review for refill   Requested Prescriptions  Pending Prescriptions Disp Refills   Naltrexone-buPROPion HCl ER (CONTRAVE) 8-90 MG TB12 70 tablet 0    Sig: WEEK 1 TAKE 1 PILL DAILY, WEEK 2 TAKE 1 PILL TWICE DAILY, WEEK 3 TAKE 2 PILL MORNING 1 PILL EVENING, WEEK 4 TAKE 2 PILLS TWICE DAILY     There is no refill protocol information for this order

## 2019-05-01 NOTE — Telephone Encounter (Signed)
Patient states been taking it but is taking 1 twice a day because the 3 was breaking her out.

## 2019-05-01 NOTE — Telephone Encounter (Signed)
Please have her schedule follow up in 1 month, 1 month supply sent in as we are supposed to evaluate if it is working 3 months after starting.

## 2019-05-10 ENCOUNTER — Telehealth: Payer: Self-pay | Admitting: Internal Medicine

## 2019-05-10 MED ORDER — TRAZODONE HCL 50 MG PO TABS: 50.0000 mg | ORAL_TABLET | Freq: Every evening | ORAL | 3 refills | Status: DC | PRN

## 2019-05-10 NOTE — Telephone Encounter (Signed)
Patient informed of MD response and stated understanding

## 2019-05-10 NOTE — Telephone Encounter (Signed)
Pt would like a medication to help her sleep.   Pt declined an appt, stating she knows the dr is not going to make her have an appointment  because she is already on a med that causes insomnia.   [id number] - [place], [place] - [place] [phone] (Phone) [phone] (Fax)

## 2019-05-10 NOTE — Telephone Encounter (Signed)
Sent in trazodone which she should try for 1 week to see if this helps. If not call back and we can change or adjust dose depending.

## 2019-05-29 ENCOUNTER — Telehealth: Payer: Self-pay

## 2019-05-29 MED ORDER — TRAZODONE HCL 100 MG PO TABS: 100.0000 mg | ORAL_TABLET | Freq: Every evening | ORAL | 6 refills | Status: DC | PRN

## 2019-05-29 NOTE — Telephone Encounter (Signed)
That is very common. Have sent in 100 mg trazodone so she can take 1 at night time with next refill.

## 2019-05-29 NOTE — Telephone Encounter (Signed)
LVM with MD response

## 2019-05-29 NOTE — Addendum Note (Signed)
Addended by: Pricilla Holm A on: 05/29/2019 08:44 AM   Modules accepted: Orders

## 2019-05-29 NOTE — Telephone Encounter (Signed)
Copied from [place] [id number]. Topic: General - Other >> May 29, 2019  8:30 AM Leward Quan A wrote: Reason for CRM: Patient called to inform Dr Sharlet Salina that the traZODone (DESYREL) 50 MG tablet is working to help her sleep but that she have to take 2 at a time. Asking for a call back at Ph# [phone]

## 2019-06-22 ENCOUNTER — Other Ambulatory Visit: Payer: Self-pay | Admitting: Internal Medicine

## 2019-06-29 ENCOUNTER — Telehealth: Payer: Self-pay

## 2019-06-29 NOTE — Telephone Encounter (Signed)
This is not adequate information to make a decision. Is she asking for some kind of cream or pill? We need to know what medication she is asking for.

## 2019-06-29 NOTE — Telephone Encounter (Signed)
Pt stated that she does not know the name of the medication but she stated that she would follow up with her dermatologist.

## 2019-06-29 NOTE — Telephone Encounter (Signed)
Copied from [place] [id number]. Topic: General - Inquiry >> Jun 29, 2019  8:21 AM Richardo Priest, NT wrote: Reason for CRM: Pt called in stating she would like PCP to refill prescription she had last year for her red spots on legs. PT does not remember name. Please advise.

## 2019-07-13 ENCOUNTER — Ambulatory Visit: Payer: PRIVATE HEALTH INSURANCE | Attending: Internal Medicine

## 2019-07-13 DIAGNOSIS — Z20822 Contact with and (suspected) exposure to covid-19: Secondary | ICD-10-CM

## 2019-07-14 LAB — NOVEL CORONAVIRUS, NAA: SARS-CoV-2, NAA: NOT DETECTED

## 2019-08-07 ENCOUNTER — Encounter: Payer: PRIVATE HEALTH INSURANCE | Admitting: Internal Medicine

## 2019-08-09 ENCOUNTER — Ambulatory Visit (INDEPENDENT_AMBULATORY_CARE_PROVIDER_SITE_OTHER): Payer: Managed Care, Other (non HMO) | Admitting: Internal Medicine

## 2019-08-09 ENCOUNTER — Other Ambulatory Visit: Payer: Self-pay

## 2019-08-09 ENCOUNTER — Encounter: Payer: Self-pay | Admitting: Internal Medicine

## 2019-08-09 VITALS — BP 138/86 | HR 95 | Temp 98.2°F | Ht 65.5 in | Wt 273.0 lb

## 2019-08-09 DIAGNOSIS — G4700 Insomnia, unspecified: Secondary | ICD-10-CM

## 2019-08-09 DIAGNOSIS — R7303 Prediabetes: Secondary | ICD-10-CM | POA: Diagnosis not present

## 2019-08-09 DIAGNOSIS — Z0000 Encounter for general adult medical examination without abnormal findings: Secondary | ICD-10-CM

## 2019-08-09 DIAGNOSIS — J3089 Other allergic rhinitis: Secondary | ICD-10-CM

## 2019-08-09 LAB — COMPREHENSIVE METABOLIC PANEL
ALT: 14 U/L (ref 0–35)
AST: 16 U/L (ref 0–37)
Albumin: 4.1 g/dL (ref 3.5–5.2)
Alkaline Phosphatase: 47 U/L (ref 39–117)
BUN: 10 mg/dL (ref 6–23)
CO2: 27 mEq/L (ref 19–32)
Calcium: 9.1 mg/dL (ref 8.4–10.5)
Chloride: 104 mEq/L (ref 96–112)
Creatinine, Ser: 0.98 mg/dL (ref 0.40–1.20)
GFR: 72.44 mL/min (ref >60.00)
Glucose, Bld: 116 mg/dL — ABNORMAL HIGH (ref 70–99)
Potassium: 3.8 mEq/L (ref 3.5–5.1)
Sodium: 139 mEq/L (ref 135–145)
Total Bilirubin: 0.8 mg/dL (ref 0.2–1.2)
Total Protein: 7.6 g/dL (ref 6.0–8.3)

## 2019-08-09 LAB — LIPID PANEL
Cholesterol: 183 mg/dL (ref 0–200)
HDL: 51.1 mg/dL (ref >39.00)
LDL Cholesterol: 109 mg/dL — ABNORMAL HIGH (ref 0–99)
NonHDL: 131.65
Total CHOL/HDL Ratio: 4
Triglycerides: 111 mg/dL (ref 0.0–149.0)
VLDL: 22.2 mg/dL (ref 0.0–40.0)

## 2019-08-09 LAB — CBC
HCT: 40.3 % (ref 36.0–46.0)
Hemoglobin: 12.9 g/dL (ref 12.0–15.0)
MCHC: 32.1 g/dL (ref 30.0–36.0)
MCV: 82.2 fl (ref 78.0–100.0)
Platelets: 309 10*3/uL (ref 150.0–400.0)
RBC: 4.9 Mil/uL (ref 3.87–5.11)
RDW: 15.4 % (ref 11.5–15.5)
WBC: 7.2 10*3/uL (ref 4.0–10.5)

## 2019-08-09 LAB — HEMOGLOBIN A1C: Hgb A1c MFr Bld: 6.7 % — ABNORMAL HIGH (ref 4.6–6.5)

## 2019-08-09 MED ORDER — TRAZODONE HCL 100 MG PO TABS: 100.0000 mg | ORAL_TABLET | Freq: Every evening | ORAL | 3 refills | Status: DC | PRN

## 2019-08-09 NOTE — Assessment & Plan Note (Signed)
Doing well this year without taking allergy medications.

## 2019-08-09 NOTE — Assessment & Plan Note (Signed)
Refill trazodone which is still doing well.

## 2019-08-09 NOTE — Assessment & Plan Note (Signed)
Weight stable and she is exercising.

## 2019-08-09 NOTE — Patient Instructions (Addendum)
We will get you in for the colonoscopy.  Think about getting the shingles shot.   Health Maintenance, Female Adopting a healthy lifestyle and getting preventive care are important in promoting health and wellness. Ask your health care provider about:  The right schedule for you to have regular tests and exams.  Things you can do on your own to prevent diseases and keep yourself healthy. What should I know about diet, weight, and exercise? Eat a healthy diet   Eat a diet that includes plenty of vegetables, fruits, low-fat dairy products, and lean protein.  Do not eat a lot of foods that are high in solid fats, added sugars, or sodium. Maintain a healthy weight Body mass index (BMI) is used to identify weight problems. It estimates body fat based on height and weight. Your health care provider can help determine your BMI and help you achieve or maintain a healthy weight. Get regular exercise Get regular exercise. This is one of the most important things you can do for your health. Most adults should:  Exercise for at least 150 minutes each week. The exercise should increase your heart rate and make you sweat (moderate-intensity exercise).  Do strengthening exercises at least twice a week. This is in addition to the moderate-intensity exercise.  Spend less time sitting. Even light physical activity can be beneficial. Watch cholesterol and blood lipids Have your blood tested for lipids and cholesterol at 51 years of age, then have this test every 5 years. Have your cholesterol levels checked more often if:  Your lipid or cholesterol levels are high.  You are older than 51 years of age.  You are at high risk for heart disease. What should I know about cancer screening? Depending on your health history and family history, you may need to have cancer screening at various ages. This may include screening for:  Breast cancer.  Cervical cancer.  Colorectal cancer.  Skin  cancer.  Lung cancer. What should I know about heart disease, diabetes, and high blood pressure? Blood pressure and heart disease  High blood pressure causes heart disease and increases the risk of stroke. This is more likely to develop in people who have high blood pressure readings, are of African descent, or are overweight.  Have your blood pressure checked: ? Every 3-5 years if you are 77-89 years of age. ? Every year if you are 54 years old or older. Diabetes Have regular diabetes screenings. This checks your fasting blood sugar level. Have the screening done:  Once every three years after age 33 if you are at a normal weight and have a low risk for diabetes.  More often and at a younger age if you are overweight or have a high risk for diabetes. What should I know about preventing infection? Hepatitis B If you have a higher risk for hepatitis B, you should be screened for this virus. Talk with your health care provider to find out if you are at risk for hepatitis B infection. Hepatitis C Testing is recommended for:  Everyone born from [birth year] through 1967.  Anyone with known risk factors for hepatitis C. Sexually transmitted infections (STIs)  Get screened for STIs, including gonorrhea and chlamydia, if: ? You are sexually active and are younger than 51 years of age. ? You are older than 51 years of age and your health care provider tells you that you are at risk for this type of infection. ? Your sexual activity has changed since you were last  screened, and you are at increased risk for chlamydia or gonorrhea. Ask your health care provider if you are at risk.  Ask your health care provider about whether you are at high risk for HIV. Your health care provider may recommend a prescription medicine to help prevent HIV infection. If you choose to take medicine to prevent HIV, you should first get tested for HIV. You should then be tested every 3 months for as long as you are taking  the medicine. Pregnancy  If you are about to stop having your period (premenopausal) and you may become pregnant, seek counseling before you get pregnant.  Take 400 to 800 micrograms (mcg) of folic acid every day if you become pregnant.  Ask for birth control (contraception) if you want to prevent pregnancy. Osteoporosis and menopause Osteoporosis is a disease in which the bones lose minerals and strength with aging. This can result in bone fractures. If you are 82 years old or older, or if you are at risk for osteoporosis and fractures, ask your health care provider if you should:  Be screened for bone loss.  Take a calcium or vitamin D supplement to lower your risk of fractures.  Be given hormone replacement therapy (HRT) to treat symptoms of menopause. Follow these instructions at home: Lifestyle  Do not use any products that contain nicotine or tobacco, such as cigarettes, e-cigarettes, and chewing tobacco. If you need help quitting, ask your health care provider.  Do not use street drugs.  Do not share needles.  Ask your health care provider for help if you need support or information about quitting drugs. Alcohol use  Do not drink alcohol if: ? Your health care provider tells you not to drink. ? You are pregnant, may be pregnant, or are planning to become pregnant.  If you drink alcohol: ? Limit how much you use to 0-1 drink a day. ? Limit intake if you are breastfeeding.  Be aware of how much alcohol is in your drink. In the [place] one drink equals one 12 oz bottle of beer (355 mL), one 5 oz glass of wine (148 mL), or one 1 oz glass of hard liquor (44 mL). General instructions  Schedule regular health, dental, and eye exams.  Stay current with your vaccines.  Tell your health care provider if: ? You often feel depressed. ? You have ever been abused or do not feel safe at home. Summary  Adopting a healthy lifestyle and getting preventive care are important in  promoting health and wellness.  Follow your health care provider's instructions about healthy diet, exercising, and getting tested or screened for diseases.  Follow your health care provider's instructions on monitoring your cholesterol and blood pressure. This information is not intended to replace advice given to you by your health care provider. Make sure you discuss any questions you have with your health care provider. Document Revised: 06/08/2018 Document Reviewed: 06/08/2018 Elsevier Patient Education  2020 [employer].

## 2019-08-09 NOTE — Assessment & Plan Note (Signed)
Flu shot up to date. Shingrix counseled. Tetanus up to date. Colonoscopy refer to GI. Mammogram up to date with gyn, pap smear up to date with gyn. Counseled about sun safety and mole surveillance. Counseled about the dangers of distracted driving. Given 10 year screening recommendations.

## 2019-08-09 NOTE — Assessment & Plan Note (Signed)
Checking HgA1c. 

## 2019-08-09 NOTE — Progress Notes (Signed)
   Subjective:   Patient ID: Melinda Prince, female    DOB: 21-Apr-1971, 49 y.o.   MRN: [id number]  HPI The patient is a 49 YO female coming in for physical.   PMH, Walker, social history reviewed and updated  Review of Systems  Constitutional: Negative.   HENT: Negative.   Eyes: Negative.   Respiratory: Negative for cough, chest tightness and shortness of breath.   Cardiovascular: Negative for chest pain, palpitations and leg swelling.  Gastrointestinal: Negative for abdominal distention, abdominal pain, constipation, diarrhea, nausea and vomiting.  Musculoskeletal: Negative.   Skin: Negative.   Neurological: Negative.   Psychiatric/Behavioral: Negative.     Objective:  Physical Exam Constitutional:      Appearance: She is well-developed. She is obese.  HENT:     Head: Normocephalic and atraumatic.  Cardiovascular:     Rate and Rhythm: Normal rate and regular rhythm.  Pulmonary:     Effort: Pulmonary effort is normal. No respiratory distress.     Breath sounds: Normal breath sounds. No wheezing or rales.  Abdominal:     General: Bowel sounds are normal. There is no distension.     Palpations: Abdomen is soft.     Tenderness: There is no abdominal tenderness. There is no rebound.  Musculoskeletal:     Cervical back: Normal range of motion.  Skin:    General: Skin is warm and dry.  Neurological:     Mental Status: She is alert and oriented to person, place, and time.     Coordination: Coordination normal.     Vitals:   08/09/19 0809  BP: 138/86  Pulse: 95  Temp: 98.2 F (36.8 C)  TempSrc: Oral  SpO2: 98%  Weight: 273 lb (123.8 kg)  Height: 5' 5.5" (1.664 m)    This visit occurred during the SARS-CoV-2 public health emergency.  Safety protocols were in place, including screening questions prior to the visit, additional usage of staff PPE, and extensive cleaning of exam room while observing appropriate contact time as indicated for disinfecting solutions.    Assessment & Plan:

## 2019-08-14 ENCOUNTER — Encounter: Payer: Self-pay | Admitting: Gastroenterology

## 2019-08-16 ENCOUNTER — Other Ambulatory Visit: Payer: Self-pay | Admitting: Internal Medicine

## 2019-08-16 MED ORDER — PRAVASTATIN SODIUM 20 MG PO TABS: 20.0000 mg | ORAL_TABLET | Freq: Every day | ORAL | 3 refills | Status: DC

## 2019-08-28 ENCOUNTER — Ambulatory Visit (AMBULATORY_SURGERY_CENTER): Payer: Self-pay | Admitting: *Deleted

## 2019-08-28 ENCOUNTER — Other Ambulatory Visit: Payer: Self-pay

## 2019-08-28 VITALS — Temp 97.6°F | Ht 65.5 in | Wt 266.0 lb

## 2019-08-28 DIAGNOSIS — Z1211 Encounter for screening for malignant neoplasm of colon: Secondary | ICD-10-CM

## 2019-08-28 DIAGNOSIS — Z01818 Encounter for other preprocedural examination: Secondary | ICD-10-CM

## 2019-08-28 MED ORDER — NA SULFATE-K SULFATE-MG SULF 17.5-3.13-1.6 GM/177ML PO SOLN: 1.0000 | Freq: Once | ORAL | 0 refills | Status: AC

## 2019-08-28 NOTE — Progress Notes (Signed)

## 2019-09-06 ENCOUNTER — Ambulatory Visit (INDEPENDENT_AMBULATORY_CARE_PROVIDER_SITE_OTHER): Payer: Managed Care, Other (non HMO)

## 2019-09-06 ENCOUNTER — Encounter: Payer: Self-pay | Admitting: Internal Medicine

## 2019-09-06 ENCOUNTER — Other Ambulatory Visit: Payer: Self-pay | Admitting: Gastroenterology

## 2019-09-06 DIAGNOSIS — Z1159 Encounter for screening for other viral diseases: Secondary | ICD-10-CM

## 2019-09-07 LAB — SARS CORONAVIRUS 2 (TAT 6-24 HRS): SARS Coronavirus 2: NEGATIVE

## 2019-09-11 ENCOUNTER — Other Ambulatory Visit: Payer: Self-pay

## 2019-09-11 ENCOUNTER — Ambulatory Visit (AMBULATORY_SURGERY_CENTER): Payer: Managed Care, Other (non HMO) | Admitting: Gastroenterology

## 2019-09-11 ENCOUNTER — Encounter: Payer: Self-pay | Admitting: Gastroenterology

## 2019-09-11 VITALS — BP 132/81 | HR 74 | Temp 96.8°F | Resp 16 | Ht 65.5 in | Wt 266.0 lb

## 2019-09-11 DIAGNOSIS — D128 Benign neoplasm of rectum: Secondary | ICD-10-CM

## 2019-09-11 DIAGNOSIS — K621 Rectal polyp: Secondary | ICD-10-CM | POA: Diagnosis not present

## 2019-09-11 DIAGNOSIS — Z1211 Encounter for screening for malignant neoplasm of colon: Secondary | ICD-10-CM

## 2019-09-11 MED ORDER — SODIUM CHLORIDE 0.9 % IV SOLN: 500.0000 mL | Freq: Once | INTRAVENOUS | Status: DC

## 2019-09-11 NOTE — Progress Notes (Signed)
Called to room to assist during endoscopic procedure.  Patient ID and intended procedure confirmed with present staff. Received instructions for my participation in the procedure from the performing physician.  

## 2019-09-11 NOTE — Progress Notes (Signed)
Pt's states no medical or surgical changes since previsit or office visit.  LC - temp CW - vtals

## 2019-09-11 NOTE — Progress Notes (Signed)
To pacu, VSS. Report to [email or web address] 

## 2019-09-11 NOTE — Op Note (Signed)
[facility] Patient Name: Melinda Prince Procedure Date: 09/11/2019 10:42 AM MRN: [id number] Endoscopist: Mallie Mussel L. Loletha Carrow , MD Age: 49 Referring MD:  Date of Birth: 1970/12/26 Gender: Female Account #: [id number] Procedure:                Colonoscopy Indications:              Screening for colorectal malignant neoplasm, This                            is the patient's first colonoscopy Medicines:                Monitored Anesthesia Care Procedure:                Pre-Anesthesia Assessment:                           - Prior to the procedure, a History and Physical                            was performed, and patient medications and                            allergies were reviewed. The patient's tolerance of                            previous anesthesia was also reviewed. The risks                            and benefits of the procedure and the sedation                            options and risks were discussed with the patient.                            All questions were answered, and informed consent                            was obtained. Prior Anticoagulants: The patient has                            taken no previous anticoagulant or antiplatelet                            agents. ASA Grade Assessment: II - A patient with                            mild systemic disease. After reviewing the risks                            and benefits, the patient was deemed in                            satisfactory condition to undergo the procedure.  After obtaining informed consent, the colonoscope                            was passed under direct vision. Throughout the                            procedure, the patient's blood pressure, pulse, and                            oxygen saturations were monitored continuously. The                            Colonoscope was introduced through the anus and                            advanced to the  the cecum, identified by                            appendiceal orifice and ileocecal valve. The                            colonoscopy was performed without difficulty. The                            patient tolerated the procedure well. The quality                            of the bowel preparation was excellent. The                            ileocecal valve, appendiceal orifice, and rectum                            were photographed. Scope In: 10:51:52 AM Scope Out: 11:05:26 AM Scope Withdrawal Time: 0 hours 8 minutes 31 seconds  Total Procedure Duration: 0 hours 13 minutes 34 seconds  Findings:                 The perianal and digital rectal examinations were                            normal.                           A few diverticula were found in the left colon and                            right colon.                           A diminutive polyp was found in the rectum. The                            polyp was sessile. The polyp was removed with a  cold biopsy forceps. Resection and retrieval were                            complete.                           The exam was otherwise without abnormality on                            direct and retroflexion views. Complications:            No immediate complications. Estimated Blood Loss:     Estimated blood loss was minimal. Impression:               - Diverticulosis in the left colon and in the right                            colon.                           - One diminutive polyp in the rectum, removed with                            a cold biopsy forceps. Resected and retrieved.                           - The examination was otherwise normal on direct                            and retroflexion views. Recommendation:           - Patient has a contact number available for                            emergencies. The signs and symptoms of potential                            delayed complications  were discussed with the                            patient. Return to normal activities tomorrow.                            Written discharge instructions were provided to the                            patient.                           - Resume previous diet.                           - Continue present medications.                           - Await pathology results.                           -  Repeat colonoscopy is recommended for                            surveillance. The colonoscopy date will be                            determined after pathology results from today's                            exam become available for review. Evangelene Vora L. Loletha Carrow, MD 09/11/2019 11:09:53 AM This report has been signed electronically.

## 2019-09-11 NOTE — Patient Instructions (Signed)
Please read handouts provided. Continue present medications. Await pathology results.   YOU HAD AN ENDOSCOPIC PROCEDURE TODAY AT THE [facility]:   Refer to the procedure report that was given to you for any specific questions about what was found during the examination.  If the procedure report does not answer your questions, please call your gastroenterologist to clarify.  If you requested that your care partner not be given the details of your procedure findings, then the procedure report has been included in a sealed envelope for you to review at your convenience later.  YOU SHOULD EXPECT: Some feelings of bloating in the abdomen. Passage of more gas than usual.  Walking can help get rid of the air that was put into your GI tract during the procedure and reduce the bloating. If you had a lower endoscopy (such as a colonoscopy or flexible sigmoidoscopy) you may notice spotting of blood in your stool or on the toilet paper. If you underwent a bowel prep for your procedure, you may not have a normal bowel movement for a few days.  Please Note:  You might notice some irritation and congestion in your nose or some drainage.  This is from the oxygen used during your procedure.  There is no need for concern and it should clear up in a day or so.  SYMPTOMS TO REPORT IMMEDIATELY:  Following lower endoscopy (colonoscopy or flexible sigmoidoscopy):  Excessive amounts of blood in the stool  Significant tenderness or worsening of abdominal pains  Swelling of the abdomen that is new, acute  Fever of 100F or higher   For urgent or emergent issues, a gastroenterologist can be reached at any hour by calling [phone]. Do not use MyChart messaging for urgent concerns.    DIET:  We do recommend a small meal at first, but then you may proceed to your regular diet.  Drink plenty of fluids but you should avoid alcoholic beverages for 24 hours.  ACTIVITY:  You should plan to take it easy  for the rest of today and you should NOT DRIVE or use heavy machinery until tomorrow (because of the sedation medicines used during the test).    FOLLOW UP: Our staff will call the number listed on your records 48-72 hours following your procedure to check on you and address any questions or concerns that you may have regarding the information given to you following your procedure. If we do not reach you, we will leave a message.  We will attempt to reach you two times.  During this call, we will ask if you have developed any symptoms of COVID 19. If you develop any symptoms (ie: fever, flu-like symptoms, shortness of breath, cough etc.) before then, please call [phone].  If you test positive for Covid 19 in the 2 weeks post procedure, please call and report this information to us.    If any biopsies were taken you will be contacted by phone or by letter within the next 1-3 weeks.  Please call us at [phone] if you have not heard about the biopsies in 3 weeks.    SIGNATURES/CONFIDENTIALITY: You and/or your care partner have signed paperwork which will be entered into your electronic medical record.  These signatures attest to the fact that that the information above on your After Visit Summary has been reviewed and is understood.  Full responsibility of the confidentiality of this discharge information lies with you and/or your care-partner.  

## 2019-09-12 ENCOUNTER — Telehealth: Payer: Self-pay

## 2019-09-12 NOTE — Telephone Encounter (Signed)
New message    The patient calling wanted to set up for Shingrix shot, patient has [employer],   Please advise.

## 2019-09-13 ENCOUNTER — Telehealth: Payer: Self-pay

## 2019-09-13 NOTE — Telephone Encounter (Signed)
Pt states she needs a list of medical necessities to join medi weight loss, for insurance purposes. Please advise.

## 2019-09-13 NOTE — Telephone Encounter (Signed)
I'm not sure what she is asking about? Is she asking for a letter of medical necessity for weight loss? Anyone over 50 okay to schedule for shingrix.

## 2019-09-13 NOTE — Telephone Encounter (Signed)
First post procedure follow up call, no answer 

## 2019-09-13 NOTE — Telephone Encounter (Signed)
°  Follow up Call-  Call back number 09/11/2019  Post procedure Call Back phone  # [phone]  Permission to leave phone message Yes  Some recent data might be hidden     Patient questions:  Do you have a fever, pain , or abdominal swelling? No. Pain Score  0 *  Have you tolerated food without any problems? Yes.    Have you been able to return to your normal activities? Yes.    Do you have any questions about your discharge instructions: Diet   No. Medications  No. Follow up visit  No.  Do you have questions or concerns about your Care? No.  Actions: * If pain score is 4 or above: No action needed, pain <4.  1. Have you developed a fever since your procedure? no  2.   Have you had an respiratory symptoms (SOB or cough) since your procedure? no  3.   Have you tested positive for COVID 19 since your procedure no  4.   Have you had any family members/close contacts diagnosed with the COVID 19 since your procedure?  no   If yes to any of these questions please route to Joylene John, RN and Alphonsa Gin, Therapist, sports.

## 2019-09-14 NOTE — Telephone Encounter (Signed)
Letter on mychart

## 2019-09-14 NOTE — Telephone Encounter (Signed)
Called pt to clarify msg concerning letter. Yesd pt states she need an medical necessity letter stating that she qualify for weight loss class due to her having diabetes. Appt already set-up for her to get the shingrix vac...Johny Chess

## 2019-09-14 NOTE — Telephone Encounter (Signed)
Notified pt w/MD response. She is requesting to pick-up letter tomorrow. Inform will leaver up front for pick-up.Marland KitchenJohny Chess

## 2019-09-18 ENCOUNTER — Encounter: Payer: Self-pay | Admitting: Gastroenterology

## 2019-09-25 ENCOUNTER — Ambulatory Visit: Payer: Managed Care, Other (non HMO)

## 2019-10-16 ENCOUNTER — Ambulatory Visit: Payer: Managed Care, Other (non HMO) | Admitting: Internal Medicine

## 2019-10-30 ENCOUNTER — Ambulatory Visit: Payer: Managed Care, Other (non HMO)

## 2019-11-19 ENCOUNTER — Other Ambulatory Visit: Payer: Self-pay | Admitting: Internal Medicine

## 2019-11-28 ENCOUNTER — Ambulatory Visit: Payer: Managed Care, Other (non HMO) | Admitting: Internal Medicine

## 2019-11-29 ENCOUNTER — Ambulatory Visit: Payer: Managed Care, Other (non HMO) | Admitting: Internal Medicine

## 2019-11-29 ENCOUNTER — Ambulatory Visit: Payer: Managed Care, Other (non HMO)

## 2020-01-08 ENCOUNTER — Other Ambulatory Visit: Payer: Self-pay | Admitting: Internal Medicine

## 2020-01-12 ENCOUNTER — Telehealth: Payer: Self-pay | Admitting: Internal Medicine

## 2020-01-12 NOTE — Telephone Encounter (Signed)
    Patient calling to report cough, would like prescription for Tessalon and Qvar inhaler

## 2020-01-15 ENCOUNTER — Telehealth (INDEPENDENT_AMBULATORY_CARE_PROVIDER_SITE_OTHER): Payer: Managed Care, Other (non HMO) | Admitting: Family

## 2020-01-15 ENCOUNTER — Encounter: Payer: Self-pay | Admitting: Family

## 2020-01-15 DIAGNOSIS — J209 Acute bronchitis, unspecified: Secondary | ICD-10-CM

## 2020-01-15 MED ORDER — AZITHROMYCIN 250 MG PO TABS: ORAL_TABLET | ORAL | 0 refills | Status: DC

## 2020-01-15 MED ORDER — BENZONATATE 200 MG PO CAPS: 200.0000 mg | ORAL_CAPSULE | Freq: Three times a day (TID) | ORAL | 0 refills | Status: DC | PRN

## 2020-01-15 MED ORDER — QVAR 40 MCG/ACT IN AERS: 2.0000 | INHALATION_SPRAY | Freq: Two times a day (BID) | RESPIRATORY_TRACT | 0 refills | Status: DC

## 2020-01-15 NOTE — Progress Notes (Signed)
Melinda Prince is a 49 y.o. female with the following history as recorded in EpicCare:  Patient Active Problem List   Diagnosis Date Noted  . Routine general medical examination at a health care facility 07/23/2017  . Right knee pain 08/16/2015  . Allergic rhinitis 01/08/2015  . Family history of cardiovascular disease 03/19/2014  . Vitamin D deficiency 03/19/2014  . Insomnia 03/19/2014  . Polycystic ovarian syndrome 03/19/2014  . Severe obesity (BMI >= 40) ([place]) 03/19/2014  . Pre-diabetes 03/19/2014    Current Outpatient Medications  Medication Sig Dispense Refill  . albuterol (VENTOLIN HFA) 108 (90 Base) MCG/ACT inhaler TAKE 2 PUFFS BY MOUTH EVERY 6 HOURS AS NEEDED FOR WHEEZE OR SHORTNESS OF BREATH 6.7 g 2  . azithromycin (ZITHROMAX) 250 MG tablet 2 tabs po qd x 1 day; 1 tablet per day x 4 days; 6 tablet 0  . beclomethasone (QVAR) 40 MCG/ACT inhaler Inhale 2 puffs into the lungs 2 (two) times daily. 1 Inhaler 0  . benzonatate (TESSALON) 200 MG capsule Take 1 capsule (200 mg total) by mouth 3 (three) times daily as needed. 30 capsule 0  . Blood Glucose Monitoring Suppl (ACCU-CHEK AVIVA PLUS) w/Device KIT Use to check blood sugars twice a day 1 kit 0  . cetirizine (ZYRTEC) 10 MG tablet TAKE 1 TABLET BY MOUTH EVERY DAY 30 tablet 11  . glucose blood (ACCU-CHEK AVIVA PLUS) test strip Use to check blood sugars twice a day 100 each 3  . Lancets (ACCU-CHEK SOFT TOUCH) lancets Use to check blood sugars twice a day 100 each 3  . Multiple Vitamin (MULTI-VITAMIN DAILY PO) Take 1 tablet by mouth daily.    Marland Kitchen omega-3 acid ethyl esters (LOVAZA) 1 g capsule Take by mouth 2 (two) times daily.    Marland Kitchen omeprazole (PRILOSEC) 20 MG capsule Take 1 capsule (20 mg total) by mouth 2 (two) times daily before a meal. 60 capsule 0  . pravastatin (PRAVACHOL) 20 MG tablet Take 1 tablet (20 mg total) by mouth daily. 90 tablet 3  . traZODone (DESYREL) 100 MG tablet TAKE 1 TABLET (100 MG TOTAL) BY MOUTH AT  BEDTIME AS NEEDED FOR SLEEP. 90 tablet 1   No current facility-administered medications for this visit.    Allergies: Patient has no known allergies.  Past Medical History:  Diagnosis Date  . Allergy   . Diabetes mellitus without complication (HCC)    diet controlled no meds  . GERD (gastroesophageal reflux disease)   . Hyperlipidemia   . Pre-diabetes   . UTI (lower urinary tract infection)     Past Surgical History:  Procedure Laterality Date  . TUBAL LIGATION      Family History  Problem Relation Age of Onset  . Diabetes Mother   . Heart disease Mother   . Hypertension Mother   . Diabetes Father   . Multiple sclerosis Sister   . Pancreatic cancer Maternal Uncle   . Colon cancer Neg Hx   . Esophageal cancer Neg Hx   . Stomach cancer Neg Hx   . Rectal cancer Neg Hx     Social History   Tobacco Use  . Smoking status: Never Smoker  . Smokeless tobacco: Never Used  Substance Use Topics  . Alcohol use: Yes    Comment: occ wine    Subjective:   I connected with Melinda Prince on 01/15/20 at  9:40 AM EDT by a video enabled telemedicine application and verified that I am speaking with the correct person using two  identifiers.   I discussed the limitations of evaluation and management by telemedicine and the availability of in person appointments. The patient expressed understanding and agreed to proceed. Provider in office/ patient is at home; provider and patient are only 2 people on video call.   Complaining of persisting cough x 7 days; notes she gets "this same cough every year in the summer." Asking for refills on QVAR and Tessalon Perles; no fever but notes that "cough is deep." No loss of sense of taste or smell- is fully vaccinated against COVID; works at [name] at [employer];     Objective:  There were no vitals filed for this visit.  General: Well developed, well nourished, in no acute distress  Head: Normocephalic and atraumatic  Lungs:  Respirations unlabored; clear to auscultation bilaterally without wheeze, rales, rhonchi  Neurologic: Alert and oriented; speech intact; face symmetrical; moves all extremities well; CNII-XII intact without focal deficit   Assessment:  1. Acute bronchitis, unspecified organism     Plan:  Rx for Z-pak #1 take as directed; Rx for QVAR 40 and Tessalon Perles; increase fluids, rest; she will get a rapid test through her employer for [place]; increase fluids, rest and follow-up worse, no better.  No follow-ups on file.  No orders of the defined types were placed in this encounter.   Requested Prescriptions   Signed Prescriptions Disp Refills  . azithromycin (ZITHROMAX) 250 MG tablet 6 tablet 0    Sig: 2 tabs po qd x 1 day; 1 tablet per day x 4 days;  . beclomethasone (QVAR) 40 MCG/ACT inhaler 1 Inhaler 0    Sig: Inhale 2 puffs into the lungs 2 (two) times daily.  . benzonatate (TESSALON) 200 MG capsule 30 capsule 0    Sig: Take 1 capsule (200 mg total) by mouth 3 (three) times daily as needed.

## 2021-02-16 ENCOUNTER — Other Ambulatory Visit: Payer: Self-pay | Admitting: Internal Medicine

## 2021-02-24 ENCOUNTER — Other Ambulatory Visit: Payer: Self-pay | Admitting: Internal Medicine

## 2021-04-14 ENCOUNTER — Other Ambulatory Visit: Payer: Self-pay | Admitting: Family

## 2021-04-18 ENCOUNTER — Emergency Department (HOSPITAL_COMMUNITY): Payer: Self-pay

## 2021-04-18 ENCOUNTER — Other Ambulatory Visit: Payer: Self-pay

## 2021-04-18 ENCOUNTER — Emergency Department (HOSPITAL_COMMUNITY)
Admission: EM | Admit: 2021-04-18 | Discharge: 2021-04-18 | Disposition: A | Discharge status: Home / Self Care | Payer: Self-pay | Attending: Emergency Medicine | Admitting: Emergency Medicine

## 2021-04-18 ENCOUNTER — Encounter (HOSPITAL_COMMUNITY): Payer: Self-pay

## 2021-04-18 DIAGNOSIS — Z20822 Contact with and (suspected) exposure to covid-19: Secondary | ICD-10-CM | POA: Insufficient documentation

## 2021-04-18 DIAGNOSIS — Z7984 Long term (current) use of oral hypoglycemic drugs: Secondary | ICD-10-CM | POA: Insufficient documentation

## 2021-04-18 DIAGNOSIS — J069 Acute upper respiratory infection, unspecified: Secondary | ICD-10-CM | POA: Insufficient documentation

## 2021-04-18 DIAGNOSIS — E119 Type 2 diabetes mellitus without complications: Secondary | ICD-10-CM | POA: Insufficient documentation

## 2021-04-18 LAB — RESP PANEL BY RT-PCR (FLU A&B, COVID) ARPGX2
Influenza A by PCR: NEGATIVE
Influenza B by PCR: NEGATIVE
SARS Coronavirus 2 by RT PCR: NEGATIVE

## 2021-04-18 MED ORDER — PREDNISONE 20 MG PO TABS: 20.0000 mg | ORAL_TABLET | Freq: Two times a day (BID) | ORAL | 0 refills | Status: AC

## 2021-04-18 MED ORDER — IPRATROPIUM-ALBUTEROL 0.5-2.5 (3) MG/3ML IN SOLN
3.0000 mL | Freq: Once | RESPIRATORY_TRACT | Status: AC
  Administered 2021-04-18: 3 mL via RESPIRATORY_TRACT
  Filled 2021-04-18: qty 3

## 2021-04-18 MED ORDER — PREDNISONE 20 MG PO TABS
60.0000 mg | ORAL_TABLET | Freq: Once | ORAL | Status: AC
  Administered 2021-04-18: 60 mg via ORAL
  Filled 2021-04-18: qty 3

## 2021-04-18 NOTE — ED Provider Notes (Signed)
[facility] DEPT Provider Note   CSN: [id number] Arrival date & time: 04/18/21  0045     History Chief Complaint  Patient presents with   Cough    Melinda Prince is a 50 y.o. female.  Patient presents chief complaint of cough shortness of breath and wheeze ongoing for about a week.  Otherwise denies any fevers at home.  Denies vomiting or diarrhea.  She states that she spoke with her primary care doctor who gave her some inhalers but her symptoms have not been improving so she presents to the ER.  Otherwise denies headache or chest pain no abdominal pain reported.      Past Medical History:  Diagnosis Date   Allergy    Diabetes mellitus without complication ([place])    diet controlled no meds   GERD (gastroesophageal reflux disease)    Hyperlipidemia    Pre-diabetes    UTI (lower urinary tract infection)     Patient Active Problem List   Diagnosis Date Noted   Routine general medical examination at a health care facility 07/23/2017   Right knee pain 08/16/2015   Allergic rhinitis 01/08/2015   Family history of cardiovascular disease 03/19/2014   Vitamin D deficiency 03/19/2014   Insomnia 03/19/2014   Polycystic ovarian syndrome 03/19/2014   Severe obesity (BMI >= 40) ([place]) 03/19/2014   Pre-diabetes 03/19/2014    Past Surgical History:  Procedure Laterality Date   TUBAL LIGATION       OB History   No obstetric history on file.     Family History  Problem Relation Age of Onset   Diabetes Mother    Heart disease Mother    Hypertension Mother    Diabetes Father    Multiple sclerosis Sister    Pancreatic cancer Maternal Uncle    Colon cancer Neg Hx    Esophageal cancer Neg Hx    Stomach cancer Neg Hx    Rectal cancer Neg Hx     Social History   Tobacco Use   Smoking status: Never   Smokeless tobacco: Never  Vaping Use   Vaping Use: Never used  Substance Use Topics   Alcohol use: Yes    Comment: occ wine    Drug use: No    Home Medications Prior to Admission medications   Medication Sig Start Date End Date Taking? Authorizing Provider  predniSONE (DELTASONE) 20 MG tablet Take 1 tablet (20 mg total) by mouth 2 (two) times daily with a meal for 5 days. 04/18/21 04/23/21 Yes Luna Fuse, MD  albuterol (VENTOLIN HFA) 108 (90 Base) MCG/ACT inhaler TAKE 2 PUFFS BY MOUTH EVERY 6 HOURS AS NEEDED FOR WHEEZE OR SHORTNESS OF BREATH 01/08/20   Hoyt Koch, MD  azithromycin (ZITHROMAX) 250 MG tablet 2 tabs po qd x 1 day; 1 tablet per day x 4 days; 01/15/20   Marrian Salvage, FNP  beclomethasone (QVAR) 40 MCG/ACT inhaler Inhale 2 puffs into the lungs 2 (two) times daily. 01/15/20   Marrian Salvage, FNP  benzonatate (TESSALON) 200 MG capsule Take 1 capsule (200 mg total) by mouth 3 (three) times daily as needed. 01/15/20   Marrian Salvage, FNP  Blood Glucose Monitoring Suppl (ACCU-CHEK AVIVA PLUS) w/Device KIT Use to check blood sugars twice a day 08/11/17   Hoyt Koch, MD  cetirizine (ZYRTEC) 10 MG tablet TAKE 1 TABLET BY MOUTH EVERY DAY 11/21/19   Hoyt Koch, MD  glucose blood (ACCU-CHEK AVIVA PLUS) test strip  Use to check blood sugars twice a day 08/11/17   Hoyt Koch, MD  Lancets [facility] SOFT [facility]) lancets Use to check blood sugars twice a day 08/11/17   Hoyt Koch, MD  Multiple Vitamin (MULTI-VITAMIN DAILY PO) Take 1 tablet by mouth daily.    [provider]  omega-3 acid ethyl esters (LOVAZA) 1 g capsule Take by mouth 2 (two) times daily.    [provider]  omeprazole (PRILOSEC) 20 MG capsule Take 1 capsule (20 mg total) by mouth 2 (two) times daily before a meal. 01/28/17   Nche, Charlene Brooke, NP  pravastatin (PRAVACHOL) 20 MG tablet Take 1 tablet (20 mg total) by mouth daily. 08/16/19   Hoyt Koch, MD  traZODone (DESYREL) 100 MG tablet TAKE 1 TABLET (100 MG TOTAL) BY MOUTH AT BEDTIME AS NEEDED FOR SLEEP.  11/21/19   Hoyt Koch, MD    Allergies    Patient has no known allergies.  Review of Systems   Review of Systems  Constitutional:  Negative for fever.  HENT:  Negative for ear pain.   Eyes:  Negative for pain.  Respiratory:  Positive for cough and shortness of breath.   Cardiovascular:  Negative for chest pain.  Gastrointestinal:  Negative for abdominal pain.  Genitourinary:  Negative for flank pain.  Musculoskeletal:  Negative for back pain.  Skin:  Negative for rash.  Neurological:  Negative for headaches.   Physical Exam Updated Vital Signs BP (!) 169/100   Pulse 73   Temp 98.1 F (36.7 C) (Oral)   Resp 16   SpO2 97%   Physical Exam Constitutional:      General: She is not in acute distress.    Appearance: Normal appearance.  HENT:     Head: Normocephalic.     Nose: Nose normal.  Eyes:     Extraocular Movements: Extraocular movements intact.  Cardiovascular:     Rate and Rhythm: Normal rate.  Pulmonary:     Effort: Pulmonary effort is normal.     Breath sounds: Wheezing present.  Musculoskeletal:        General: Normal range of motion.     Cervical back: Normal range of motion.  Neurological:     General: No focal deficit present.     Mental Status: She is alert. Mental status is at baseline.    ED Results / Procedures / Treatments   Labs (all labs ordered are listed, but only abnormal results are displayed) Labs Reviewed  RESP PANEL BY RT-PCR (FLU A&B, COVID) [id number]    EKG None  Radiology DG Chest 2 View  Result Date: 04/18/2021 CLINICAL DATA:  Cough, chest pain, shortness of breath EXAM: CHEST - 2 VIEW COMPARISON:  Chest radiograph 07/02/2014 FINDINGS: The cardiomediastinal silhouette is normal. There is no focal consolidation or pulmonary edema. There is no pleural effusion or pneumothorax. There is no acute osseous abnormality. IMPRESSION: No radiographic evidence of acute cardiopulmonary process. Electronically Signed   By: Valetta Mole M.D.   On: 04/18/2021 09:14    Procedures Procedures   Medications Ordered in ED Medications  predniSONE (DELTASONE) tablet 60 mg (60 mg Oral Given 04/18/21 1047)  ipratropium-albuterol (DUONEB) 0.5-2.5 (3) MG/3ML nebulizer solution 3 mL (3 mLs Nebulization Given 04/18/21 1047)    ED Course  I have reviewed the triage vital signs and the nursing notes.  Pertinent labs & imaging results that were available during my care of the patient were reviewed by me and considered  in my medical decision making (see chart for details).    MDM Rules/Calculators/A&P                           Patient given breathing treatment and steroids here with improvement.  Continues have O2 saturation 9597% room air which is appropriate.  Recommending outpatient follow-up with her doctor within the week.  Recommend immediate return for worsening symptoms or any additional concerns.  Final Clinical Impression(s) / ED Diagnoses Final diagnoses:  Upper respiratory tract infection, unspecified type    Rx / DC Orders ED Discharge Orders          Ordered    predniSONE (DELTASONE) 20 MG tablet  2 times daily with meals        04/18/21 1218             Luna Fuse, MD 04/18/21 1218

## 2021-04-18 NOTE — ED Triage Notes (Signed)
Pt arrived via POV, c/o cough and sore throat. States x1 week, had tele visit with PCP, rx with inhalers not helping.

## 2021-04-18 NOTE — Discharge Instructions (Signed)
Call your primary care doctor or specialist as discussed in the next 2-3 days.   Return immediately back to the ER if:  Your symptoms worsen within the next 12-24 hours. You develop new symptoms such as new fevers, persistent vomiting, new pain, shortness of breath, or new weakness or numbness, or if you have any other concerns.  

## 2021-04-24 ENCOUNTER — Other Ambulatory Visit: Payer: Self-pay | Admitting: Internal Medicine

## 2021-07-04 ENCOUNTER — Other Ambulatory Visit: Payer: Self-pay | Admitting: Family

## 2023-08-06 ENCOUNTER — Encounter: Payer: Self-pay | Admitting: Internal Medicine

## 2023-08-06 ENCOUNTER — Ambulatory Visit (INDEPENDENT_AMBULATORY_CARE_PROVIDER_SITE_OTHER): Payer: PRIVATE HEALTH INSURANCE | Admitting: Internal Medicine

## 2023-08-06 VITALS — BP 128/86 | HR 80 | Temp 98.1°F | Ht 65.5 in | Wt 267.0 lb

## 2023-08-06 DIAGNOSIS — Z7984 Long term (current) use of oral hypoglycemic drugs: Secondary | ICD-10-CM

## 2023-08-06 DIAGNOSIS — E1169 Type 2 diabetes mellitus with other specified complication: Secondary | ICD-10-CM | POA: Insufficient documentation

## 2023-08-06 DIAGNOSIS — E118 Type 2 diabetes mellitus with unspecified complications: Secondary | ICD-10-CM

## 2023-08-06 DIAGNOSIS — Z7985 Long-term (current) use of injectable non-insulin antidiabetic drugs: Secondary | ICD-10-CM

## 2023-08-06 DIAGNOSIS — Z1231 Encounter for screening mammogram for malignant neoplasm of breast: Secondary | ICD-10-CM | POA: Diagnosis not present

## 2023-08-06 DIAGNOSIS — E785 Hyperlipidemia, unspecified: Secondary | ICD-10-CM

## 2023-08-06 DIAGNOSIS — I10 Essential (primary) hypertension: Secondary | ICD-10-CM | POA: Insufficient documentation

## 2023-08-06 LAB — CBC
HCT: 36.7 % (ref 36.0–46.0)
Hemoglobin: 12 g/dL (ref 12.0–15.0)
MCHC: 32.6 g/dL (ref 30.0–36.0)
MCV: 80.8 fL (ref 78.0–100.0)
Platelets: 561 10*3/uL — ABNORMAL HIGH (ref 150.0–400.0)
RBC: 4.55 Mil/uL (ref 3.87–5.11)
RDW: 16 % — ABNORMAL HIGH (ref 11.5–15.5)
WBC: 7.7 10*3/uL (ref 4.0–10.5)

## 2023-08-06 LAB — HEMOGLOBIN A1C: Hgb A1c MFr Bld: 8.4 % — ABNORMAL HIGH (ref 4.6–6.5)

## 2023-08-06 LAB — COMPREHENSIVE METABOLIC PANEL
ALT: 19 U/L (ref 0–35)
AST: 17 U/L (ref 0–37)
Albumin: 4.2 g/dL (ref 3.5–5.2)
Alkaline Phosphatase: 66 U/L (ref 39–117)
BUN: 12 mg/dL (ref 6–23)
CO2: 27 meq/L (ref 19–32)
Calcium: 9.5 mg/dL (ref 8.4–10.5)
Chloride: 103 meq/L (ref 96–112)
Creatinine, Ser: 0.84 mg/dL (ref 0.40–1.20)
GFR: 78.55 mL/min (ref >60.00)
Glucose, Bld: 175 mg/dL — ABNORMAL HIGH (ref 70–99)
Potassium: 4.2 meq/L (ref 3.5–5.1)
Sodium: 143 meq/L (ref 135–145)
Total Bilirubin: 0.5 mg/dL (ref 0.2–1.2)
Total Protein: 8.1 g/dL (ref 6.0–8.3)

## 2023-08-06 LAB — LIPID PANEL
Cholesterol: 116 mg/dL (ref 0–200)
HDL: 51.1 mg/dL (ref >39.00)
LDL Cholesterol: 43 mg/dL (ref 0–99)
NonHDL: 65
Total CHOL/HDL Ratio: 2
Triglycerides: 112 mg/dL (ref 0.0–149.0)
VLDL: 22.4 mg/dL (ref 0.0–40.0)

## 2023-08-06 MED ORDER — ZEPBOUND 2.5 MG/0.5ML ~~LOC~~ SOAJ: 2.5000 mg | SUBCUTANEOUS | 0 refills | Status: DC

## 2023-08-06 MED ORDER — ZEPBOUND 7.5 MG/0.5ML ~~LOC~~ SOAJ: 7.5000 mg | SUBCUTANEOUS | 0 refills | Status: AC

## 2023-08-06 MED ORDER — ZEPBOUND 5 MG/0.5ML ~~LOC~~ SOAJ: 5.0000 mg | SUBCUTANEOUS | 0 refills | Status: DC

## 2023-08-06 MED ORDER — ZEPBOUND 10 MG/0.5ML ~~LOC~~ SOAJ: 10.0000 mg | SUBCUTANEOUS | 0 refills | Status: AC

## 2023-08-06 NOTE — Progress Notes (Signed)
   Subjective:   Patient ID: Melinda Prince, female    DOB: 09-07-1970, 53 y.o.   MRN: [id number]  HPI The patient is a new 53 YO female coming in for ongoing care and management see A/P for details.   Nathanel bunker ob/gyn  PMH, [facility], social history reviewed and updated  Review of Systems  Constitutional: Negative.   HENT: Negative.    Eyes: Negative.   Respiratory:  Negative for cough, chest tightness and shortness of breath.   Cardiovascular:  Negative for chest pain, palpitations and leg swelling.  Gastrointestinal:  Negative for abdominal distention, abdominal pain, constipation, diarrhea, nausea and vomiting.  Musculoskeletal: Negative.   Skin: Negative.   Neurological: Negative.   Psychiatric/Behavioral: Negative.      Objective:  Physical Exam Constitutional:      Appearance: She is well-developed.  HENT:     Head: Normocephalic and atraumatic.  Cardiovascular:     Rate and Rhythm: Normal rate and regular rhythm.  Pulmonary:     Effort: Pulmonary effort is normal. No respiratory distress.     Breath sounds: Normal breath sounds. No wheezing or rales.  Abdominal:     General: Bowel sounds are normal. There is no distension.     Palpations: Abdomen is soft.     Tenderness: There is no abdominal tenderness. There is no rebound.  Musculoskeletal:     Cervical back: Normal range of motion.  Skin:    General: Skin is warm and dry.  Neurological:     Mental Status: She is alert and oriented to person, place, and time.     Coordination: Coordination normal.     Vitals:   08/06/23 0807  BP: 128/86  Pulse: 80  Temp: 98.1 F (36.7 C)  TempSrc: Oral  SpO2: 98%  Weight: 267 lb (121.1 kg)  Height: 5' 5.5 (1.664 m)    Assessment & Plan:

## 2023-08-06 NOTE — Assessment & Plan Note (Signed)
Checking lipid panel and adjust lipitor 20 mg daily as needed. 

## 2023-08-06 NOTE — Assessment & Plan Note (Signed)
 BP is borderline today on losartan  25 mg daily. Checking CMP and microalbumin to creatinine ratio and adjust as needed.

## 2023-08-06 NOTE — Assessment & Plan Note (Signed)
 She states insurance will cover zepbound  or wegovy. Previously has taken ozempic and mounjaro for her diabetes (which are not covered per patient). She felt mounjaro did better for weight with fewer side effects. Rx zepbound  first 4 months of medication and follow up around 3 months.

## 2023-08-06 NOTE — Assessment & Plan Note (Signed)
 Checking HgA1c, microalbumin to creatinine ratio and CMP and lipid panel. She is taking metformin  and we are adding zepbound  (insurance does not cover mounjaro per patient). She is taking lipitor and is on ARB.

## 2023-08-06 NOTE — Patient Instructions (Addendum)
 We will send in zepbound  start with 2.5 mg month 1, 5 mg month 2, 7.5 mg month 3, 10 mg month 4 and then let us  know how you are doing.  We will check the labs today.

## 2023-08-09 ENCOUNTER — Encounter: Payer: Self-pay | Admitting: Internal Medicine

## 2023-08-12 ENCOUNTER — Other Ambulatory Visit (HOSPITAL_COMMUNITY): Payer: Self-pay

## 2023-08-12 ENCOUNTER — Telehealth: Payer: Self-pay

## 2023-08-12 LAB — MICROALBUMIN / CREATININE URINE RATIO
Creatinine,U: 204.7 mg/dL
Microalb Creat Ratio: 8 mg/g (ref 0.0–30.0)
Microalb, Ur: 1.6 mg/dL (ref 0.0–1.9)

## 2023-08-12 NOTE — Telephone Encounter (Signed)
Pharmacy Patient Advocate Encounter   Received notification from  [facility] Portal that prior authorization for Zepbound 2.5MG /0.5ML pen-injectors is required/requested.   Insurance verification completed.   The patient is insured through [facility] .   Per test claim: PA required; PA submitted to above mentioned insurance via CoverMyMeds Key/confirmation #/EOC [id number] Status is pending

## 2023-08-12 NOTE — Telephone Encounter (Signed)
See updated microalbumin results

## 2023-08-13 ENCOUNTER — Other Ambulatory Visit (HOSPITAL_COMMUNITY): Payer: Self-pay

## 2023-08-13 NOTE — Telephone Encounter (Signed)
Pharmacy Patient Advocate Encounter  Received notification from [facility] that Prior Authorization for Zepbound 2.5MG /0.5ML pen-injectors has been APPROVED from 08/12/23 to 02/09/24. Ran test claim, Copay is $24.99. This test claim was processed through [facility]- copay amounts may vary at other pharmacies due to pharmacy/plan contracts, or as the patient moves through the different stages of their insurance plan.   PA #/Case ID/Reference #: [id number]

## 2023-08-26 ENCOUNTER — Ambulatory Visit: Payer: PRIVATE HEALTH INSURANCE

## 2023-09-13 ENCOUNTER — Ambulatory Visit
Admission: RE | Admit: 2023-09-13 | Discharge: 2023-09-13 | Disposition: A | Discharge status: Home / Self Care | Payer: PRIVATE HEALTH INSURANCE | Source: Ambulatory Visit | Attending: Internal Medicine | Admitting: Internal Medicine

## 2023-09-13 DIAGNOSIS — Z1231 Encounter for screening mammogram for malignant neoplasm of breast: Secondary | ICD-10-CM

## 2023-09-15 ENCOUNTER — Other Ambulatory Visit: Payer: Self-pay | Admitting: Internal Medicine

## 2023-09-15 DIAGNOSIS — R928 Other abnormal and inconclusive findings on diagnostic imaging of breast: Secondary | ICD-10-CM

## 2023-09-16 ENCOUNTER — Ambulatory Visit
Admission: RE | Admit: 2023-09-16 | Discharge: 2023-09-16 | Disposition: A | Discharge status: Home / Self Care | Payer: PRIVATE HEALTH INSURANCE | Source: Ambulatory Visit | Attending: Internal Medicine | Admitting: Internal Medicine

## 2023-09-16 DIAGNOSIS — R928 Other abnormal and inconclusive findings on diagnostic imaging of breast: Secondary | ICD-10-CM

## 2023-09-17 ENCOUNTER — Other Ambulatory Visit: Payer: Self-pay | Admitting: Internal Medicine

## 2023-09-17 DIAGNOSIS — R921 Mammographic calcification found on diagnostic imaging of breast: Secondary | ICD-10-CM

## 2023-09-21 ENCOUNTER — Ambulatory Visit
Admission: RE | Admit: 2023-09-21 | Discharge: 2023-09-21 | Disposition: A | Discharge status: Home / Self Care | Payer: PRIVATE HEALTH INSURANCE | Source: Ambulatory Visit | Attending: Internal Medicine | Admitting: Internal Medicine

## 2023-09-21 DIAGNOSIS — R921 Mammographic calcification found on diagnostic imaging of breast: Secondary | ICD-10-CM

## 2023-09-21 HISTORY — PX: BREAST BIOPSY: SHX20

## 2023-09-22 LAB — SURGICAL PATHOLOGY

## 2023-10-12 ENCOUNTER — Other Ambulatory Visit: Payer: Self-pay | Admitting: Internal Medicine

## 2023-10-12 ENCOUNTER — Other Ambulatory Visit: Payer: Self-pay

## 2023-10-12 NOTE — Telephone Encounter (Signed)
 Copied from CRM [id number]. Topic: Clinical - Medication Refill >> Oct 12, 2023  9:38 AM Cottie Diss B wrote: Most Recent Primary Care Visit:  Provider: Bambi Lever A  Department: [facility]  Visit Type: NEW PATIENT  Date: 08/06/2023  Medication: atorvastatin (LIPITOR) 20 MG tablet, losartan (COZAAR) 25 MG tablet, metFORMIN (GLUCOPHAGE-XR) 500 MG 24 hr tablet  Has the patient contacted their pharmacy? Yes (Agent: If no, request that the patient contact the pharmacy for the refill. If patient does not wish to contact the pharmacy document the reason why and proceed with request.) (Agent: If yes, when and what did the pharmacy advise?)  Is this the correct pharmacy for this prescription? Yes If no, delete pharmacy and type the correct one.  This is the patient's preferred pharmacy:   [facility] - [place], [place] - [address] Bryon Caraway [address] [place] [address] Phone: [phone] Fax: [phone]   Has the prescription been filled recently? Yes  Is the patient out of the medication? Yes  Has the patient been seen for an appointment in the last year OR does the patient have an upcoming appointment? Yes  Can we respond through MyChart? Yes  Agent: Please be advised that Rx refills may take up to 3 business days. We ask that you follow-up with your pharmacy.

## 2023-10-12 NOTE — Telephone Encounter (Signed)
 Copied from CRM [id number]. Topic: Clinical - Medication Refill >> Oct 12, 2023  9:44 AM Cottie Diss B wrote: Most Recent Primary Care Visit:  Provider: Bambi Lever A  Department: [facility]  Visit Type: NEW PATIENT  Date: 08/06/2023  Medication: Atarax (Hydroxyzine) 10mg , Toprol XL (Metoprolol) 50mg   Has the patient contacted their pharmacy? Yes (Agent: If no, request that the patient contact the pharmacy for the refill. If patient does not wish to contact the pharmacy document the reason why and proceed with request.) (Agent: If yes, when and what did the pharmacy advise?)  Is this the correct pharmacy for this prescription? Yes If no, delete pharmacy and type the correct one.  This is the patient's preferred pharmacy:   [facility] - [place], [place] - [address] Bryon Caraway [address] [place] [address] Phone: [phone] Fax: [phone]   Has the prescription been filled recently? Yes  Is the patient out of the medication? Yes Atarax, No Metoprolol  Has the patient been seen for an appointment in the last year OR does the patient have an upcoming appointment? Yes  Can we respond through MyChart? Yes  Agent: Please be advised that Rx refills may take up to 3 business days. We ask that you follow-up with your pharmacy.

## 2023-10-18 MED ORDER — LOSARTAN POTASSIUM 25 MG PO TABS: 25.0000 mg | ORAL_TABLET | Freq: Every day | ORAL | 0 refills | Status: DC

## 2023-10-18 MED ORDER — ATORVASTATIN CALCIUM 20 MG PO TABS: 20.0000 mg | ORAL_TABLET | Freq: Every day | ORAL | 0 refills | Status: DC

## 2023-10-18 MED ORDER — METFORMIN HCL ER 500 MG PO TB24: 500.0000 mg | ORAL_TABLET | Freq: Every day | ORAL | 0 refills | Status: DC

## 2023-10-20 ENCOUNTER — Telehealth: Payer: Self-pay | Admitting: Internal Medicine

## 2023-10-20 NOTE — Telephone Encounter (Signed)
 Copied from CRM [id number]. Topic: General - Other >> Oct 20, 2023  2:29 PM Adonis Hoot wrote: Reason for CRM: Patient is requesting a call directly and personally fro Dr Nicolette Barrio regarding a medication.She stated that she has spoken to everyone but her and that's who she would like to speak with.  ---  Unclear who pt has spoken too or what matter they want to discuss, no documentation found from our office after 2.13.25

## 2023-10-21 MED ORDER — METFORMIN HCL ER 500 MG PO TB24: 500.0000 mg | ORAL_TABLET | Freq: Every day | ORAL | 0 refills | Status: DC

## 2023-10-21 MED ORDER — ATORVASTATIN CALCIUM 20 MG PO TABS: 20.0000 mg | ORAL_TABLET | Freq: Every day | ORAL | 0 refills | Status: DC

## 2023-10-21 NOTE — Addendum Note (Signed)
 Addended by: Arvell Birchwood on: 10/21/2023 03:43 PM   Modules accepted: Orders

## 2023-11-03 ENCOUNTER — Encounter: Payer: Self-pay | Admitting: Internal Medicine

## 2023-11-03 ENCOUNTER — Ambulatory Visit: Payer: PRIVATE HEALTH INSURANCE | Admitting: Internal Medicine

## 2023-11-03 VITALS — BP 130/72 | HR 84 | Temp 97.8°F | Ht 65.5 in | Wt 261.0 lb

## 2023-11-03 DIAGNOSIS — Z7985 Long-term (current) use of injectable non-insulin antidiabetic drugs: Secondary | ICD-10-CM

## 2023-11-03 DIAGNOSIS — Z7984 Long term (current) use of oral hypoglycemic drugs: Secondary | ICD-10-CM

## 2023-11-03 DIAGNOSIS — I10 Essential (primary) hypertension: Secondary | ICD-10-CM

## 2023-11-03 DIAGNOSIS — Z6841 Body Mass Index (BMI) 40.0 and over, adult: Secondary | ICD-10-CM | POA: Diagnosis not present

## 2023-11-03 DIAGNOSIS — E118 Type 2 diabetes mellitus with unspecified complications: Secondary | ICD-10-CM | POA: Diagnosis not present

## 2023-11-03 LAB — POCT GLYCOSYLATED HEMOGLOBIN (HGB A1C): HbA1c POC (<> result, manual entry): 5.9 % (ref 4.0–5.6)

## 2023-11-03 MED ORDER — ZEPBOUND 15 MG/0.5ML ~~LOC~~ SOAJ: 15.0000 mg | SUBCUTANEOUS | 3 refills | Status: DC

## 2023-11-03 MED ORDER — ZEPBOUND 12.5 MG/0.5ML ~~LOC~~ SOAJ: 12.5000 mg | SUBCUTANEOUS | 0 refills | Status: DC

## 2023-11-03 MED ORDER — HYDROXYZINE HCL 10 MG PO TABS: 10.0000 mg | ORAL_TABLET | Freq: Three times a day (TID) | ORAL | 1 refills | Status: DC | PRN

## 2023-11-03 NOTE — Assessment & Plan Note (Signed)
 Doing well on zepbound  and taking 7.5 mg weekly. Down about 6-7 pounds. Advised to continue titration with 10 mg next month, then 12.5 mg then 15 mg and stay there. Follow up 3-6 months.

## 2023-11-03 NOTE — Assessment & Plan Note (Signed)
 Doing well with metformin  500 mg daily and zepbound  7.5 mg weekly. POC HGa1c at 5.9 today which is markedly improved. Continue titration of zepbound . Follow up 3-6 months.

## 2023-11-03 NOTE — Assessment & Plan Note (Signed)
 BP is at goal today with weight loss. Continue losartan  25 mg daily and monitor closely. This has improved with zepbound  treatment.

## 2023-11-03 NOTE — Progress Notes (Signed)
   Subjective:   Patient ID: Melinda Prince, female    DOB: November 28, 1970, 53 y.o.   MRN: [id number]  HPI The patient is a 53 YO female coming in for medical management (see A/P for details).   Review of Systems  Constitutional: Negative.   HENT: Negative.    Eyes: Negative.   Respiratory:  Negative for cough, chest tightness and shortness of breath.   Cardiovascular:  Negative for chest pain, palpitations and leg swelling.  Gastrointestinal:  Negative for abdominal distention, abdominal pain, constipation, diarrhea, nausea and vomiting.  Musculoskeletal: Negative.   Skin: Negative.   Neurological: Negative.   Psychiatric/Behavioral: Negative.      Objective:  Physical Exam Constitutional:      Appearance: She is well-developed.  HENT:     Head: Normocephalic and atraumatic.  Cardiovascular:     Rate and Rhythm: Normal rate and regular rhythm.  Pulmonary:     Effort: Pulmonary effort is normal. No respiratory distress.     Breath sounds: Normal breath sounds. No wheezing or rales.  Abdominal:     General: Bowel sounds are normal. There is no distension.     Palpations: Abdomen is soft.     Tenderness: There is no abdominal tenderness. There is no rebound.  Musculoskeletal:     Cervical back: Normal range of motion.  Skin:    General: Skin is warm and dry.  Neurological:     Mental Status: She is alert and oriented to person, place, and time.     Coordination: Coordination normal.     Vitals:   11/03/23 0815  BP: 130/72  Pulse: 84  Temp: 97.8 F (36.6 C)  TempSrc: Oral  SpO2: 99%  Weight: 261 lb (118.4 kg)  Height: 5' 5.5" (1.664 m)    Assessment & Plan:

## 2023-11-04 ENCOUNTER — Other Ambulatory Visit: Payer: Self-pay

## 2023-11-04 MED ORDER — METFORMIN HCL ER 500 MG PO TB24: 500.0000 mg | ORAL_TABLET | Freq: Every day | ORAL | 0 refills | Status: AC

## 2023-11-04 MED ORDER — LOSARTAN POTASSIUM 25 MG PO TABS: 25.0000 mg | ORAL_TABLET | Freq: Every day | ORAL | 0 refills | Status: DC

## 2023-11-04 MED ORDER — ZEPBOUND 12.5 MG/0.5ML ~~LOC~~ SOAJ: 12.5000 mg | SUBCUTANEOUS | 0 refills | Status: AC

## 2023-11-04 MED ORDER — METOPROLOL SUCCINATE ER 50 MG PO TB24: 50.0000 mg | ORAL_TABLET | Freq: Every day | ORAL | 1 refills | Status: AC

## 2023-11-04 MED ORDER — TRAZODONE HCL 100 MG PO TABS: 100.0000 mg | ORAL_TABLET | Freq: Every evening | ORAL | 1 refills | Status: AC | PRN

## 2023-11-04 MED ORDER — ZEPBOUND 15 MG/0.5ML ~~LOC~~ SOAJ: 15.0000 mg | SUBCUTANEOUS | 3 refills | Status: AC

## 2023-11-04 MED ORDER — ATORVASTATIN CALCIUM 20 MG PO TABS: 20.0000 mg | ORAL_TABLET | Freq: Every day | ORAL | 0 refills | Status: AC

## 2023-11-04 MED ORDER — HYDROXYZINE HCL 10 MG PO TABS: 10.0000 mg | ORAL_TABLET | Freq: Three times a day (TID) | ORAL | 1 refills | Status: AC | PRN

## 2023-11-04 MED ORDER — ALBUTEROL SULFATE HFA 108 (90 BASE) MCG/ACT IN AERS: 2.0000 | INHALATION_SPRAY | Freq: Four times a day (QID) | RESPIRATORY_TRACT | 2 refills | Status: AC | PRN

## 2023-11-04 MED ORDER — CETIRIZINE HCL 10 MG PO TABS: 10.0000 mg | ORAL_TABLET | Freq: Every day | ORAL | 11 refills | Status: AC

## 2024-03-07 ENCOUNTER — Other Ambulatory Visit: Payer: Self-pay | Admitting: Internal Medicine

## 2024-06-16 ENCOUNTER — Ambulatory Visit: Payer: PRIVATE HEALTH INSURANCE | Admitting: Internal Medicine

## 2024-08-03 ENCOUNTER — Telehealth: Payer: Self-pay

## 2024-08-03 DIAGNOSIS — N644 Mastodynia: Secondary | ICD-10-CM

## 2024-08-03 NOTE — Telephone Encounter (Signed)
 Is she having a problem? Mammogram not due until March 2026 and was recommended to go back to annual screening not diagnostic and US 

## 2024-08-03 NOTE — Telephone Encounter (Signed)
 Copied from CRM [id number]. Topic: Clinical - Lab/Test Results >> Aug 03, 2024  2:17 PM Avram MATSU wrote: Reason for CRM: patient needs an order to go to [facility] for diagnostic mammogram and US  right side breast. If patient needs to be seen sooner please call, [phone]   [address] Fax:[phone]

## 2024-08-04 NOTE — Addendum Note (Signed)
 Addended by: ROLLENE NORRIS A on: 08/04/2024 09:55 AM   Modules accepted: Orders

## 2024-08-04 NOTE — Telephone Encounter (Signed)
 Pt is aware.

## 2024-08-04 NOTE — Telephone Encounter (Signed)
 I have ordered both for her.

## 2024-08-25 ENCOUNTER — Ambulatory Visit: Payer: PRIVATE HEALTH INSURANCE | Admitting: Internal Medicine
